# Patient Record
Sex: Female | Born: 1960 | Race: White | Hispanic: No
Health system: Midwestern US, Academic
[De-identification: ages and names within clinical notes are randomized; demographics above are authoritative.]

---

## 2018-07-09 ENCOUNTER — Encounter: Admit: 2018-07-09 | Discharge: 2018-07-09 | Payer: BC Managed Care – HMO | Primary: Adult Health

## 2018-07-09 ENCOUNTER — Encounter: Admit: 2018-07-09 | Discharge: 2018-07-09 | Payer: BC Managed Care – HMO | Primary: Family

## 2018-07-10 ENCOUNTER — Ambulatory Visit: Admit: 2018-07-10 | Discharge: 2018-07-11 | Payer: BC Managed Care – HMO | Primary: Family

## 2018-07-10 ENCOUNTER — Encounter: Admit: 2018-07-10 | Discharge: 2018-07-10 | Payer: BC Managed Care – HMO | Primary: Adult Health

## 2018-07-10 ENCOUNTER — Encounter: Admit: 2018-07-10 | Discharge: 2018-07-10 | Payer: BC Managed Care – HMO | Primary: Family

## 2018-07-10 DIAGNOSIS — F640 Transsexualism: Principal | ICD-10-CM

## 2018-07-10 DIAGNOSIS — F649 Gender identity disorder, unspecified: ICD-10-CM

## 2018-07-10 DIAGNOSIS — I2699 Other pulmonary embolism without acute cor pulmonale: ICD-10-CM

## 2018-07-10 DIAGNOSIS — I80202 Phlebitis and thrombophlebitis of unspecified deep vessels of left lower extremity: ICD-10-CM

## 2018-07-10 DIAGNOSIS — E139 Other specified diabetes mellitus without complications: Principal | ICD-10-CM

## 2018-07-10 MED ORDER — SPIRONOLACTONE 100 MG PO TAB
100 mg | ORAL_TABLET | Freq: Two times a day (BID) | ORAL | 3 refills | 90.00000 days | Status: DC
Start: 2018-07-10 — End: 2018-11-09

## 2018-07-10 NOTE — Progress Notes
[facility] Follow-Up Visit    Legal Name: Barbara Obrien  Preferred Name: Barbara Obrien  Preferred Pronouns: she/her/hers    Chief Complaint:  Follow up, establishing care as a transfer from Dr. Georgina Quint, but also has new diagnosis of DVT/PE from last week     HPI:  Barbara Obrien is a 58 y.o. transgender or gender nonconforming person hereto establish care/transition from DR. Glass.    Started hormones: 2013  Current regimen: Estrace 6mg Cleda Daub 100mg  BID  Gender therapist: none  PCP: Alfredo Martinez, NP  Prior gender surgeries/treatments: top surgery    Hx: Barbara Obrien started transition in 2013 with the support of her wife. Unfortunately at the same time, her wife was diagnosed with renal cancer and died in 01/21/12. She has adult boys in their 36s and an adopted 80 year old daughter. She mostly feels supported although one of her four brothers is not accepting.       Last week, Barbara Obrien developed a DVT and PE and was treated at [facility]. She is home now, and her PCP has removed her from E2 and started her on Eliquis. They hope she will only need the blood thinner for three months.       Review of Systems:  Constitutional: Negative for fatigue and unexpected weight change.   HENT: Negative for dental problem and hearing loss.    Eyes: Negative.  Negative for visual disturbance.   Respiratory: Negative for shortness of breath and wheezing.    Cardiovascular: Negative for chest pain.   Gastrointestinal: Negative for nausea, abdominal pain, diarrhea and constipation.   Endocrine: Negative for cold intolerance and heat intolerance.   Genitourinary: Negative for dysuria, urgency, frequency and difficulty urinating.   Musculoskeletal: Negative for joint swelling and arthralgias.   Skin: Negative for rash.   Allergic/Immunologic: Negative for immunocompromised state.   Neurological: Negative for dizziness, seizures, weakness and headaches.   Hematological: Negative.

## 2018-07-11 ENCOUNTER — Encounter: Admit: 2018-07-11 | Discharge: 2018-07-11 | Payer: BC Managed Care – HMO | Primary: Adult Health

## 2018-07-16 ENCOUNTER — Encounter: Admit: 2018-07-16 | Discharge: 2018-07-16 | Payer: BC Managed Care – HMO | Primary: Adult Health

## 2018-07-16 ENCOUNTER — Encounter: Admit: 2018-07-16 | Discharge: 2018-07-16 | Payer: BC Managed Care – HMO | Primary: Family

## 2018-07-17 ENCOUNTER — Encounter: Admit: 2018-07-17 | Discharge: 2018-07-17 | Payer: BC Managed Care – HMO | Primary: Adult Health

## 2018-07-17 DIAGNOSIS — F649 Gender identity disorder, unspecified: Principal | ICD-10-CM

## 2018-07-18 ENCOUNTER — Encounter: Admit: 2018-07-18 | Discharge: 2018-07-18 | Payer: BC Managed Care – HMO | Primary: Family

## 2018-07-19 ENCOUNTER — Encounter: Admit: 2018-07-19 | Discharge: 2018-07-19 | Payer: BC Managed Care – HMO | Primary: Adult Health

## 2018-07-25 ENCOUNTER — Encounter: Admit: 2018-07-25 | Discharge: 2018-07-25 | Payer: BC Managed Care – HMO | Primary: Family

## 2018-08-09 DIAGNOSIS — F640 Transsexualism: Secondary | ICD-10-CM

## 2018-08-09 NOTE — Progress Notes
CONSENT: Th obtained patient's verbal consent to provide this telebehavioral health visit due to the [facility] Emergency.    NAME: Barbara Obrien  DATE: 08/09/2018   DOB: 09/29/1960 TIME: 2:30 PM - 4:05 PM   MRN: [id number] PROVIDER: Kelli Hope, PhD     Present at [facility] Session: Patient    Type of Service Provided: Mental Health Intake and Evaluation via [facility], Gender Confirmation Surgical Clearance Letter Writing    Appropriateness of TBH: Th assessed and discussed the appropriateness of telebehavioral health with Barbara Obrien, and Barbara Obrien was determined: Appropriate    Presenting Problem: Barbara Obrien presented to intake to acquire a referral letter for gender confirmation surgery.     Current Stressors/Precipitants: Barbara Obrien attributed most of her problems to gender and genital dysphoria.    Coping Strategies/Self-Care Behaviors: Barbara Obrien reported that she copes with distress by seeking support from loved ones.    History of Presenting Problem/Treatment History: Barbara Obrien reported a history of gender dysphoria since age 26, which is documented in the referral letter for gender confirmation surgery.     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, and described her work positively.     Social Functioning: Barbara Obrien reported good social support from her daughter, her son, and her friends.    Substance Use History: Barbara Obrien denied any history of alcohol or drug use problems, or prescription drug misuse.    Medical/Developmental History: Barbara Obrien reported that she was hormone replacement therapy since age 63, but was forced to discontinue due a blood clot last spring. She has been living full-time as a woman since age 39. At age 50, Barbara Obrien underwent a breast augmentation, and noted that she is pleased with the results. Barbara Obrien is interested in an orchiectomy at this time.    Family History/Family Mental and Physical Health History: Barbara Obrien reported that her second wife passed of kidney cancer seven years ago. Barbara Obrien described their marriage of twenty years fondly and highlighted that her wife was very accepting of her gender identity. Barbara Obrien disclosed a previous marriage of five years, which she never revealed her true gender identity. Barbara Obrien reported that she has a 7 year old son from her first marriage and a 21 year old daughter from her second marriage. Barbara Obrien described her relationships with her children positively.     Trauma/Relationship Violence History: Barbara Obrien denied any history of emotional, physical, or sexual abuse, or witnessing domestic violence.    MENTAL STATUS EXAMINATION:   Appearance: well-groomed   Oriented to:  person, place, time   Relatedness:  engaged    Eye Contact:  good    Attitude:  cooperative    Motor Behavior:  normal   Affect:  appropriate, full-range   Mood:  euthymic   Speech Quality:  normal in rate, rhythm, and volume   Thought Processes:  logical   Judgement:  intact   Insight:  present   Sensorium: intact   Attention: intact   Memory:  intact   Risk Assessment: No suicidal or homicidal ideation with plan, means, or intent were expressed at time of intake.     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    TREATMENT INTERVENTION(S): Acceptance and Committment Therapy, Interpersonal Process Therapy, Surgical Referral Letter Writing    TREATMENT RESPONSE: Barbara Obrien was highlyreceptive to treatment interventions aimed at establishing rapport; orienting to telebehavioral health services; reviewing the [facility] criteria for surgery; providing psychoeducation on gender  confirmation surgery at [facility]; discussing the risks, benefits, alternatives, and limitations of surgery - including the implications for sexual and reproductive health; identifying therapeutic/surgical goals, addressing body/genital dysphoria, and writing a referral letter for gender confirmation surgery.     TREATMENT PLAN/GOALS: Plan is for Barbara Obrien to pursue gender confirmation surgery through [facility].     Kelli Hope, PhD  Licensed Psychologist

## 2018-08-10 ENCOUNTER — Encounter: Admit: 2018-08-10 | Discharge: 2018-08-10 | Primary: Family

## 2018-08-10 ENCOUNTER — Ambulatory Visit: Admit: 2018-08-09 | Discharge: 2018-08-10 | Primary: Family

## 2018-08-10 DIAGNOSIS — F649 Gender identity disorder, unspecified: Secondary | ICD-10-CM

## 2018-08-13 ENCOUNTER — Encounter: Admit: 2018-08-13 | Discharge: 2018-08-13 | Primary: Family

## 2018-10-16 ENCOUNTER — Ambulatory Visit: Admit: 2018-10-16 | Discharge: 2018-10-16 | Primary: Family

## 2018-10-16 ENCOUNTER — Encounter: Admit: 2018-10-16 | Discharge: 2018-10-16 | Primary: Family

## 2018-10-16 DIAGNOSIS — I2699 Other pulmonary embolism without acute cor pulmonale: Secondary | ICD-10-CM

## 2018-10-16 DIAGNOSIS — F649 Gender identity disorder, unspecified: Secondary | ICD-10-CM

## 2018-10-16 DIAGNOSIS — E139 Other specified diabetes mellitus without complications: Secondary | ICD-10-CM

## 2018-10-16 DIAGNOSIS — I80202 Phlebitis and thrombophlebitis of unspecified deep vessels of left lower extremity: Secondary | ICD-10-CM

## 2018-10-16 NOTE — Progress Notes
Obtained patient's verbal consent to treat them and their agreement to [facility] financial policy and NPP via this telehealth visit during the [facility] Emergency      Initial Consultation for Female-to-Female Gender Affirming Genital Surgery     Legal Name: Barbara Obrien  Preferred Name: Barbara Obrien  Preferred Pronouns: she/her/hers    Chief Complaint:  Gender Affirming Surgery Consult     HPI:  Barbara Obrien is a 58 y.o. transgender or gender nonconforming person interested in alleviating gender dysphoria through the feminizing of their body with gender affirming genital surgery.     Started hormones: 2013  Started living full time as chosen gender: 2014  Hair removal: none  BMI: Body mass index is 25.82 kg/m???.  Last tobacco: non smoker   Surgical support plan: family  Is patient actively engaged in therapy: No  Letters:    #1 Dr. Dirk Dress    #2 Lum Babe LCSW -- old letter from 2013      Reports having a UTI July 2020 which has been difficult to treat. Reports she has UTIs 1-2 times per year. Saw Dr. Linda Hedges, MD a urologist at [facility]. Reports she undergoes urethral dilation about once a year. Cystoscopy reportedly normal.     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 Took eliquis x 3 months. No plan for long term anticoagulation.     PCP: previously Dr. Georgina Quint.   Currently sees Darden Dates, APRN at [facility]   Lives with daughter and son        Medical History:   Diagnosis Date   ??? Deep vein thrombophlebitis of leg, left (HCC)    ??? Diabetes 1.5, managed as type 2 (HCC)    ??? Gender dysphoria    ??? Pulmonary emboli [facility])        Surgical History:   Procedure Laterality Date   ??? ACL RECONSTRUCTION Right    ??? HX BREAST AUGMENTATION Bilateral    ??? HX ROTATOR CUFF REPAIR Right        Social History     Tobacco Use   ??? Smoking status: Never Smoker   ??? Smokeless tobacco: Never Used   Substance Use Topics   ??? Alcohol use: Not Currently Alcohol/week: 1.0 standard drinks     Types: 1 Cans of beer per week       Family History   Problem Relation Age of Onset   ??? Cancer Mother    ??? Diabetes Mother    ??? Cancer Father        Medications:    Current Outpatient Medications:   ???  apixaban (ELIQUIS) 5 mg tablet, Take 5 mg by mouth., Disp: , Rfl:   ???  aspirin 81 mg chewable tablet, Chew 81 mg by mouth daily. Take with food., Disp: , Rfl:   ???  atorvastatin (LIPITOR) 20 mg tablet, Take 20 mg by mouth daily., Disp: , Rfl:   ???  estradioL (ESTRACE) 2 mg tablet, Take 6 mg by mouth daily., Disp: , Rfl:   ???  glipiZIDE (GLUCOTROL) 10 mg tablet, Take 10 mg by mouth twice daily., Disp: , Rfl:   ???  lisinopriL (ZESTRIL) 10 mg tablet, Take 10 mg by mouth daily., Disp: , Rfl:   ???  metFORMIN (GLUCOPHAGE) 1,000 mg tablet, Take 1,000 mg by mouth twice daily with meals., Disp: , Rfl:   ???  omeprazole DR (PRILOSEC) 20 mg  capsule, Take 20 mg by mouth daily before breakfast., Disp: , Rfl:   ???  spironolactone (ALDACTONE) 100 mg tablet, Take 100 mg by mouth twice daily. Take with food., Disp: , Rfl:   ???  spironolactone (ALDACTONE) 100 mg tablet, Take one tablet by mouth twice daily. Take with food., Disp: 180 tablet, Rfl: 3    Allergies:  Allergies   Allergen Reactions   ??? Sulfa (Sulfonamide Antibiotics) HIVES   ??? Tramadol UNKNOWN         Review of Systems:  Constitutional: Negative for fatigue and unexpected weight change.   HENT: Negative for dental problem and hearing loss.    Eyes: Negative.  Negative for visual disturbance.   Respiratory: Negative for shortness of breath and wheezing.    Cardiovascular: Negative for chest pain.   Gastrointestinal: Negative for nausea, abdominal pain, diarrhea and constipation.   Endocrine: Negative for cold intolerance and heat intolerance.   Genitourinary: Negative for dysuria, urgency, frequency and difficulty urinating.   Musculoskeletal: Negative for joint swelling and arthralgias.   Skin: Negative for rash. Allergic/Immunologic: Negative for immunocompromised state.   Neurological: Negative for dizziness, seizures, weakness and headaches.   Hematological: Negative.    Psychiatric/Behavioral: Negative for suicidal ideas, hallucinations, SI/HI, and decreased concentration. The patient is not nervous/anxious.      Physical Examination:  Ht 167.6 cm (66)  - Wt 72.6 kg (160 lb)  - BMI 25.82 kg/m???   Constitutional: Oriented to person, place, and time. Appears well-developed and well-nourished.   Head: Normocephalic.   Neck: Normal range of motion.   Cardiovascular: Normal rate.    Pulmonary/Chest: Effort normal.   Musculoskeletal: Normal range of motion.    Psychiatric: Normal mood and affect.        Assessment and Plan:     Gender Dysphoria   Understands limitations in surgical options in terms of her health, surgical risks and recovery. In a perfect world, would want fully depth penile inversion vaginoplasty. Would be satisfied if orchiectomy is the only option for.     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 And if that is the case, vulvoplasty (zero depth vagina) makes the most sense with her goals and medical conditions.    Reviewed our surgical preparation process.   Before surgery needs the following:     1- a1c 8% or less - documented by PCP clearance letter  2- cardiologist risk assessment  3- records from urologist   4- get up to date letter from Lum Babe for bottom surgery            Visit billed by time. Total Time 60 minutes. Estimated counseling time 45 minutes. Counseled patient regarding above issues.

## 2018-10-18 ENCOUNTER — Encounter: Admit: 2018-10-18 | Discharge: 2018-10-18 | Primary: Family

## 2018-10-18 NOTE — Progress Notes
sent release of information to patient to fill out and return.  Spoke to patient and discuss to do list for surgery.  Stated she understood. JPG

## 2018-10-19 ENCOUNTER — Encounter: Admit: 2018-10-19 | Discharge: 2018-10-19 | Primary: Family

## 2018-10-19 DIAGNOSIS — F649 Gender identity disorder, unspecified: Secondary | ICD-10-CM

## 2018-10-23 ENCOUNTER — Encounter: Admit: 2018-10-23 | Discharge: 2018-10-23 | Payer: BC Managed Care – HMO | Primary: Family

## 2018-10-24 ENCOUNTER — Encounter: Admit: 2018-10-24 | Discharge: 2018-10-24 | Primary: Family

## 2018-10-24 NOTE — Telephone Encounter
10/24/18 Records received from Debbora Presto, APRN. Record are available in pt chart through [facility] ed[facility]

## 2018-11-01 ENCOUNTER — Encounter: Admit: 2018-11-01 | Discharge: 2018-11-01 | Primary: Family

## 2018-11-01 DIAGNOSIS — I2699 Other pulmonary embolism without acute cor pulmonale: Secondary | ICD-10-CM

## 2018-11-01 DIAGNOSIS — I80202 Phlebitis and thrombophlebitis of unspecified deep vessels of left lower extremity: Secondary | ICD-10-CM

## 2018-11-01 DIAGNOSIS — E139 Other specified diabetes mellitus without complications: Secondary | ICD-10-CM

## 2018-11-01 DIAGNOSIS — F649 Gender identity disorder, unspecified: Secondary | ICD-10-CM

## 2018-11-02 ENCOUNTER — Encounter: Admit: 2018-11-02 | Discharge: 2018-11-02 | Primary: Family

## 2018-11-02 DIAGNOSIS — F649 Gender identity disorder, unspecified: Secondary | ICD-10-CM

## 2018-11-02 DIAGNOSIS — I80202 Phlebitis and thrombophlebitis of unspecified deep vessels of left lower extremity: Secondary | ICD-10-CM

## 2018-11-02 DIAGNOSIS — E139 Other specified diabetes mellitus without complications: Secondary | ICD-10-CM

## 2018-11-02 DIAGNOSIS — E119 Type 2 diabetes mellitus without complications: Secondary | ICD-10-CM

## 2018-11-02 DIAGNOSIS — I2699 Other pulmonary embolism without acute cor pulmonale: Secondary | ICD-10-CM

## 2018-11-02 DIAGNOSIS — N390 Urinary tract infection, site not specified: Secondary | ICD-10-CM

## 2018-11-09 ENCOUNTER — Encounter: Admit: 2018-11-09 | Discharge: 2018-11-09 | Primary: Family

## 2018-11-09 ENCOUNTER — Ambulatory Visit: Admit: 2018-11-09 | Discharge: 2018-11-09 | Primary: Family

## 2018-11-09 DIAGNOSIS — N390 Urinary tract infection, site not specified: Secondary | ICD-10-CM

## 2018-11-09 DIAGNOSIS — I159 Secondary hypertension, unspecified: Secondary | ICD-10-CM

## 2018-11-09 DIAGNOSIS — E118 Type 2 diabetes mellitus with unspecified complications: Secondary | ICD-10-CM

## 2018-11-09 DIAGNOSIS — E119 Type 2 diabetes mellitus without complications: Secondary | ICD-10-CM

## 2018-11-09 DIAGNOSIS — I2699 Other pulmonary embolism without acute cor pulmonale: Secondary | ICD-10-CM

## 2018-11-09 DIAGNOSIS — I82512 Chronic embolism and thrombosis of left femoral vein: Secondary | ICD-10-CM

## 2018-11-09 DIAGNOSIS — I80202 Phlebitis and thrombophlebitis of unspecified deep vessels of left lower extremity: Secondary | ICD-10-CM

## 2018-11-09 DIAGNOSIS — Z01810 Encounter for preprocedural cardiovascular examination: Secondary | ICD-10-CM

## 2018-11-09 DIAGNOSIS — F649 Gender identity disorder, unspecified: Secondary | ICD-10-CM

## 2018-11-09 DIAGNOSIS — E139 Other specified diabetes mellitus without complications: Secondary | ICD-10-CM

## 2018-11-09 DIAGNOSIS — I2693 Single subsegmental pulmonary embolism without acute cor pulmonale: Secondary | ICD-10-CM

## 2018-11-09 MED ORDER — ATORVASTATIN 20 MG PO TAB
10 mg | ORAL_TABLET | Freq: Every day | ORAL | 0 refills | Status: AC
Start: 2018-11-09 — End: ?

## 2018-11-09 NOTE — Progress Notes
Echo and NUC ordered per [place].  Pt requested testing to be done at [facility].  Faxed orders to [facility] scheduling.  Scheduling will call pt to schedule.  No Prior Auth required  Ref: [id number] CR

## 2018-11-09 NOTE — Patient Instructions
It was nice to see you today.    Dr. Virgina Organ would like an echo and stress test.  [facility] scheduling will call you directly to schedule.    Scheduling phone # [phone]

## 2018-11-15 ENCOUNTER — Ambulatory Visit: Admit: 2018-11-15 | Discharge: 2018-11-15 | Primary: Family

## 2018-11-15 ENCOUNTER — Encounter: Admit: 2018-11-15 | Discharge: 2018-11-15 | Primary: Family

## 2018-11-15 DIAGNOSIS — Z01810 Encounter for preprocedural cardiovascular examination: Secondary | ICD-10-CM

## 2018-11-15 DIAGNOSIS — I10 Essential (primary) hypertension: Secondary | ICD-10-CM

## 2018-11-19 ENCOUNTER — Encounter: Admit: 2018-11-19 | Discharge: 2018-11-19 | Payer: BC Managed Care – HMO | Primary: Family

## 2018-11-23 ENCOUNTER — Encounter: Admit: 2018-11-23 | Discharge: 2018-11-23 | Payer: BC Managed Care – HMO | Primary: Family

## 2018-11-23 NOTE — Telephone Encounter
spoke to patient this AM to check status for surgery.  1- a1c 8% or less - documented by PCP clearance letter- seeing PCP next week to discuss and check lab level.  Understands it must be 8% or lower  2- cardiologist risk assessment- done and results are in O[facility].  Cleared  3- records from urologist -Dr. Pearline Cables reviewing  4- get up to date letter from [facility] Axe for bottom surgery- Has txt out to [place] asking her to send    Barbara Obrien

## 2018-11-28 ENCOUNTER — Encounter: Admit: 2018-11-28 | Discharge: 2018-11-28 | Payer: BC Managed Care – HMO | Primary: Family

## 2018-11-28 NOTE — Telephone Encounter
[facility] asking for [facility] OV to be refaxed.  Refaxed for continuation of care. Attempted to call her per request, but no answer.      Fax #  [phone]

## 2018-11-30 ENCOUNTER — Encounter: Admit: 2018-11-30 | Discharge: 2018-11-30 | Payer: BC Managed Care – HMO | Primary: Family

## 2019-01-22 ENCOUNTER — Encounter: Admit: 2019-01-22 | Discharge: 2019-01-22 | Payer: BC Managed Care – HMO | Primary: Family

## 2019-01-23 ENCOUNTER — Encounter: Admit: 2019-01-23 | Discharge: 2019-01-23 | Payer: BC Managed Care – HMO | Primary: Family

## 2019-01-23 MED ORDER — SPIRONOLACTONE 100 MG PO TAB
100 mg | ORAL_TABLET | Freq: Two times a day (BID) | ORAL | 3 refills | 46.00000 days | Status: AC
Start: 2019-01-23 — End: ?

## 2020-02-11 ENCOUNTER — Encounter: Admit: 2020-02-11 | Discharge: 2020-02-11 | Payer: BC Managed Care – HMO | Primary: Family

## 2020-02-14 ENCOUNTER — Encounter: Admit: 2020-02-14 | Discharge: 2020-02-14 | Payer: BC Managed Care – HMO | Primary: Family

## 2020-03-05 ENCOUNTER — Encounter: Admit: 2020-03-05 | Discharge: 2020-03-05 | Payer: BC Managed Care – HMO | Primary: Family

## 2020-03-05 DIAGNOSIS — N35919 Unspecified urethral stricture, male, unspecified site: Secondary | ICD-10-CM

## 2020-04-22 MED ORDER — METHYLPREDNISOLONE 32 MG PO TAB
32 mg | ORAL_TABLET | ORAL | 0 refills | Status: AC
Start: 2020-04-22 — End: ?

## 2020-04-22 MED ORDER — DIPHENHYDRAMINE HCL 25 MG PO TAB
50 mg | ORAL_TABLET | Freq: Once | ORAL | 0 refills | 6.00000 days | Status: AC
Start: 2020-04-22 — End: ?

## 2020-04-23 ENCOUNTER — Encounter

## 2020-04-23 DIAGNOSIS — R300 Dysuria: Secondary | ICD-10-CM

## 2020-04-23 DIAGNOSIS — F649 Gender identity disorder, unspecified: Secondary | ICD-10-CM

## 2020-04-23 DIAGNOSIS — E139 Other specified diabetes mellitus without complications: Secondary | ICD-10-CM

## 2020-04-23 DIAGNOSIS — N35919 Unspecified urethral stricture, male, unspecified site: Secondary | ICD-10-CM

## 2020-04-23 DIAGNOSIS — I2699 Other pulmonary embolism without acute cor pulmonale: Secondary | ICD-10-CM

## 2020-04-23 DIAGNOSIS — I80202 Phlebitis and thrombophlebitis of unspecified deep vessels of left lower extremity: Secondary | ICD-10-CM

## 2020-04-23 DIAGNOSIS — N390 Urinary tract infection, site not specified: Secondary | ICD-10-CM

## 2020-04-23 DIAGNOSIS — E119 Type 2 diabetes mellitus without complications: Secondary | ICD-10-CM

## 2020-04-23 MED ORDER — IOTHALAMATE MEGLUMINE 60 % IJ SOLN
10 mL | Freq: Once | URETHRAL | 0 refills | Status: CP
Start: 2020-04-23 — End: ?
  Administered 2020-04-23: 15:00:00 20 mL via URETHRAL

## 2020-04-30 ENCOUNTER — Encounter: Admit: 2020-04-30 | Discharge: 2020-04-30 | Payer: No Typology Code available for payment source | Primary: Family

## 2020-04-30 MED ORDER — NITROFURANTOIN MONOHYD/M-CRYST 100 MG PO CAP
100 mg | ORAL_CAPSULE | Freq: Two times a day (BID) | ORAL | 0 refills | 7.00000 days | Status: AC
Start: 2020-04-30 — End: ?

## 2020-05-22 ENCOUNTER — Encounter: Admit: 2020-05-22 | Discharge: 2020-05-22 | Payer: No Typology Code available for payment source | Primary: Family

## 2020-05-22 NOTE — Progress Notes
Please send the following records for continuity of care:    STAT    Most recent office note  Most recent medication list  Most recent discharge summary  Most recent labs/imaging  Most recent hospital records and etc         Please ATTN to : Day      Thank you,    DaiJanise Day Justinian Miano, NRCMA   [facility]   Cardiovascular Medicine   [address] [place] [address]  Phone: [phone]  Fax: [phone]

## 2020-05-22 NOTE — Telephone Encounter
I called the patient to update the medical chart. We went over medications and PCP. I also was told labs there hasnt been any hospitalizations and labs were drawn at PCP office a month ago.I will request the records.

## 2020-05-25 ENCOUNTER — Encounter: Admit: 2020-05-25 | Discharge: 2020-05-25 | Payer: No Typology Code available for payment source | Primary: Family

## 2020-05-31 ENCOUNTER — Encounter: Admit: 2020-05-31 | Discharge: 2020-05-31 | Payer: No Typology Code available for payment source | Primary: Family

## 2020-05-31 DIAGNOSIS — I80202 Phlebitis and thrombophlebitis of unspecified deep vessels of left lower extremity: Secondary | ICD-10-CM

## 2020-05-31 DIAGNOSIS — N390 Urinary tract infection, site not specified: Secondary | ICD-10-CM

## 2020-05-31 DIAGNOSIS — F649 Gender identity disorder, unspecified: Secondary | ICD-10-CM

## 2020-05-31 DIAGNOSIS — E139 Other specified diabetes mellitus without complications: Secondary | ICD-10-CM

## 2020-05-31 DIAGNOSIS — E119 Type 2 diabetes mellitus without complications: Secondary | ICD-10-CM

## 2020-05-31 DIAGNOSIS — I2699 Other pulmonary embolism without acute cor pulmonale: Secondary | ICD-10-CM

## 2020-06-02 ENCOUNTER — Encounter: Admit: 2020-06-02 | Discharge: 2020-06-02 | Payer: No Typology Code available for payment source | Primary: Family

## 2020-06-02 DIAGNOSIS — E139 Other specified diabetes mellitus without complications: Secondary | ICD-10-CM

## 2020-06-02 DIAGNOSIS — E119 Type 2 diabetes mellitus without complications: Secondary | ICD-10-CM

## 2020-06-02 DIAGNOSIS — F649 Gender identity disorder, unspecified: Secondary | ICD-10-CM

## 2020-06-02 DIAGNOSIS — I80202 Phlebitis and thrombophlebitis of unspecified deep vessels of left lower extremity: Secondary | ICD-10-CM

## 2020-06-02 DIAGNOSIS — I2699 Other pulmonary embolism without acute cor pulmonale: Secondary | ICD-10-CM

## 2020-06-02 DIAGNOSIS — N390 Urinary tract infection, site not specified: Secondary | ICD-10-CM

## 2020-06-05 ENCOUNTER — Encounter: Admit: 2020-06-05 | Discharge: 2020-06-05 | Payer: No Typology Code available for payment source | Primary: Family

## 2020-06-05 ENCOUNTER — Ambulatory Visit: Admit: 2020-06-05 | Discharge: 2020-06-05 | Payer: No Typology Code available for payment source | Primary: Family

## 2020-06-05 DIAGNOSIS — E118 Type 2 diabetes mellitus with unspecified complications: Secondary | ICD-10-CM

## 2020-06-05 DIAGNOSIS — I2699 Other pulmonary embolism without acute cor pulmonale: Secondary | ICD-10-CM

## 2020-06-05 DIAGNOSIS — N390 Urinary tract infection, site not specified: Secondary | ICD-10-CM

## 2020-06-05 DIAGNOSIS — I2693 Single subsegmental pulmonary embolism without acute cor pulmonale: Secondary | ICD-10-CM

## 2020-06-05 DIAGNOSIS — I82512 Chronic embolism and thrombosis of left femoral vein: Secondary | ICD-10-CM

## 2020-06-05 DIAGNOSIS — E119 Type 2 diabetes mellitus without complications: Secondary | ICD-10-CM

## 2020-06-05 DIAGNOSIS — I80202 Phlebitis and thrombophlebitis of unspecified deep vessels of left lower extremity: Secondary | ICD-10-CM

## 2020-06-05 DIAGNOSIS — E139 Other specified diabetes mellitus without complications: Secondary | ICD-10-CM

## 2020-06-05 DIAGNOSIS — F649 Gender identity disorder, unspecified: Secondary | ICD-10-CM

## 2020-06-05 DIAGNOSIS — Z01810 Encounter for preprocedural cardiovascular examination: Secondary | ICD-10-CM

## 2020-06-05 MED ORDER — ROSUVASTATIN 20 MG PO TAB
20 mg | ORAL_TABLET | Freq: Every day | ORAL | 1 refills | 90.00000 days | Status: AC
Start: 2020-06-05 — End: ?

## 2020-06-05 NOTE — Progress Notes
Date of Service: 06/05/2020    Barbara Obrien is a 60 y.o. adult.       HPI     Patient is a 60 year old currently undergoing hormonal treatment and anticipated surgical treatment to transition from a female to female.    He does have a history of hypertension, diabetes mellitus type 2 with a severely abnormal hemoglobin A1c in the past that was 12%.  At present time patient is treated with Metformin and insulin.    In September 2020 he was evaluated with a perfusion imaging study, it was negative for ischemia and the LVEF was normal.    He was also evaluated with an echocardiogram on 11/15/2018, the study showed normal systolic function without any valvular abnormalities and normal pulmonary artery pressure.    Patient does not report having any symptoms of chest pain no heart palpitations.    He does have a history of left lower extremity DVT x2, he is currently anticoagulated with apixaban.  ,       Vitals:    06/05/20 1120   BP: 124/78   BP Source: Arm, Left Upper   Patient Position: Sitting   Pulse: 72   SpO2: 98%   Weight: 75.3 kg (166 lb)   Height: 170.2 cm (5' 7)   PainSc: Zero     Body mass index is 26 kg/m?Marland Kitchen     Past Medical History  Patient Active Problem List    Diagnosis Date Noted   ? Pre-operative cardiovascular examination 11/09/2018   ? Gender dysphoria 11/02/2018   ? Diabetes type 2, controlled (HCC) 11/02/2018   ? Pulmonary emboli (HCC) 11/02/2018   ? UTI (urinary tract infection) 11/02/2018   ? Deep vein thrombosis (DVT) of left lower extremity (HCC) 11/02/2018     06/29/2018  Left LE [place]:  Occlusive deep venous thrombosis appreciated within the left lower extremity veins.     04/27/2020 US Duplex Venous Reflux Study Bil, [facility].  LLE:  Nonocclusive thrombus seen within left popliteal vein and within mid to distal aspect of duplicated superficial femoral vein.  Chronic DVT.  Venous reflux study with evidence of minimal reflux within the left superficial femoral vein. Review of Systems   Constitutional: Negative.   HENT: Negative.    Eyes: Negative.    Cardiovascular: Negative.    Respiratory: Negative.    Endocrine: Negative.    Hematologic/Lymphatic: Negative.    Skin: Negative.    Musculoskeletal: Negative.    Gastrointestinal: Negative.    Genitourinary: Negative.    Neurological: Negative.    Psychiatric/Behavioral: Negative.    Allergic/Immunologic: Negative.        Physical Exam  General Appearance: normal in appearance  Skin: warm, moist, no ulcers or xanthomas  Eyes: conjunctivae and lids normal, pupils are equal and round  Lips & Oral Mucosa: no pallor or cyanosis  Neck Veins: neck veins are flat, neck veins are not distended  Chest Inspection: chest is normal in appearance, patient has breasts  Respiratory Effort: breathing comfortably, no respiratory distress  Auscultation/Percussion: lungs clear to auscultation, no rales or rhonchi, no wheezing  Cardiac Rhythm: regular rhythm and normal rate  Cardiac Auscultation: S1, S2 normal, no rub, no gallop  Murmurs: no murmur  Carotid Arteries: normal carotid upstroke bilaterally, no bruit  Lower Extremity Edema: no lower extremity edema  Abdominal Exam: soft, non-tender, no masses, bowel sounds normal  Liver & Spleen: no organomegaly  Language and Memory: patient responsive and seems  to comprehend information  Neurologic Exam: neurological assessment grossly intact      Cardiovascular Studies  Twelve-lead EKG demonstrated normal sinus rhythm, no ST segment T wave changes    Cardiovascular Health Factors  Vitals BP Readings from Last 3 Encounters:   06/05/20 124/78   04/23/20 (!) 155/79   11/15/18 128/81     Wt Readings from Last 3 Encounters:   06/05/20 75.3 kg (166 lb)   04/23/20 76.1 kg (167 lb 12.8 oz)   11/15/18 72.6 kg (160 lb)     BMI Readings from Last 3 Encounters:   06/05/20 26.00 kg/m?   04/23/20 26.28 kg/m?   11/15/18 25.82 kg/m?      Smoking Social History     Tobacco Use   Smoking Status Never Smoker Smokeless Tobacco Never Used      Lipid Profile Cholesterol   Date Value Ref Range Status   01/03/2020 216 (H) 100 - 199 Final     HDL   Date Value Ref Range Status   01/03/2020 44  Final     LDL   Date Value Ref Range Status   01/03/2020 138 (H) 0 - 99 Final     Triglycerides   Date Value Ref Range Status   01/03/2020 190 (H) 0 - 149 Final      Blood Sugar Hemoglobin A1C   Date Value Ref Range Status   10/08/2018 11.7 (H) 4.8 - 5.6 Final     Glucose   Date Value Ref Range Status   05/06/2020 145 (H) 65 - 99 Final   10/08/2018 409 (H) 66 - 99 Final   08/01/2018 330  Final          Problems Addressed Today  Encounter Diagnoses   Name Primary?   ? Single subsegmental pulmonary embolism without acute cor pulmonale (HCC) Yes   ? Chronic deep vein thrombosis (DVT) of femoral vein of left lower extremity (HCC)    ? Controlled diabetes mellitus type 2 with complications, unspecified whether long term insulin use (HCC)    ? Pre-operative cardiovascular examination    ? Gender identity disorder, unspecified        Assessment and Plan     In summary: This is a 60 year old with the following cardiovascular/clinical issues:    1.  Gender dysphoria?patient is currently in the process of undergoing hormonal treatment and surgical treatment to transition from a female to female  2.  Lower extremity DVT x2?patient is anticoagulated with apixaban  3.  History of subsegmental PE secondary to the lower extremity DVT  4.  Diabetes mellitus type 2 requiring insulin?historically this has been poorly controlled  5.  Hyperlipidemia?patient is on statin therapy  6.  Hypertension?patient is on metoprolol  7.  Family history of premature CAD?patient has 2 brothers that underwent stent placement  8.  Preoperative cardiovascular evaluation preceding genitalia surgery as part of transitioning from female to female  55.  Overall unremarkable cardiovascular evaluation performed September 2020    Plan:    1.  Continue all current medications  2.  Goal hemoglobin A1c is letter 7%  3.  Goal LDL less than 70 mg/dL  4.  From a cardiac standpoint this patient is stable to undergo general anesthesia and the planned surgical intervention, he is overall low risk from a cardiac standpoint.  In terms of risks and benefits of the procedure this will have to be discussed with the anesthesiologist on the surgeon at that time and this procedure will  be scheduled.    Total Time Today was 30 minutes in the following activities: Preparing to see the patient, Obtaining and/or reviewing separately obtained history, Performing a medically appropriate examination and/or evaluation, Counseling and educating the patient/family/caregiver, Ordering medications, tests, or procedures, Referring and communication with other health care professionals (when not separately reported), Documenting clinical information in the electronic or other health record, Independently interpreting results (not separately reported) and communicating results to the patient/family/caregiver and Care coordination (not separately reported)         Current Medications (including today's revisions)  ? amitriptyline (ELAVIL) 25 mg tablet Take 25 mg by mouth at bedtime daily.   ? apixaban (ELIQUIS) 5 mg tablet Take 5 mg by mouth twice daily.   ? atorvastatin (LIPITOR) 20 mg tablet Take one-half tablet by mouth daily.   ? glipiZIDE (GLUCOTROL) 10 mg tablet Take 10 mg by mouth daily.   ? JANUVIA 50 mg tablet Take 50 mg by mouth daily.   ? LANTUS SOLOSTAR U-100 INSULIN 100 unit/mL (3 mL) injection PEN Inject 10 Units under the skin at bedtime daily.   ? lisinopriL (ZESTRIL) 10 mg tablet Take 10 mg by mouth daily.   ? metFORMIN (GLUCOPHAGE) 1,000 mg tablet Take 500 mg by mouth twice daily with meals.   ? methylprednisolone (MEDROL) 32 mg tablet Take one tablet by mouth as directed. Take 32mg  by mouth 12 hours before appointment, then take 32mg  by mouth 2 hours before appointment time   ? metoprolol tartrate 25 mg tablet Take 12.5 mg by mouth twice daily.   ? omeprazole DR (PRILOSEC) 20 mg capsule Take 20 mg by mouth daily before breakfast.   ? ondansetron (ZOFRAN ODT) 4 mg rapid dissolve tablet Dissolve 1 tablet by mouth as Needed.   ? spironolactone (ALDACTONE) 100 mg tablet Take one tablet by mouth twice daily. Take with food.

## 2020-07-09 ENCOUNTER — Encounter: Admit: 2020-07-09 | Discharge: 2020-07-09 | Payer: No Typology Code available for payment source | Primary: Family

## 2020-07-10 ENCOUNTER — Encounter: Admit: 2020-07-10 | Discharge: 2020-07-10 | Payer: No Typology Code available for payment source | Primary: Family

## 2020-07-10 NOTE — Progress Notes
Hgb A1c received from [facility], dated 07/08/20. Hgb A1c 6.8, will place results to be scanned into chart.

## 2020-07-13 ENCOUNTER — Encounter: Admit: 2020-07-13 | Discharge: 2020-07-13 | Payer: No Typology Code available for payment source | Primary: Family

## 2020-07-20 ENCOUNTER — Encounter: Admit: 2020-07-20 | Discharge: 2020-07-20 | Payer: No Typology Code available for payment source | Primary: Family

## 2020-08-31 ENCOUNTER — Encounter: Admit: 2020-08-31 | Discharge: 2020-08-31 | Payer: No Typology Code available for payment source | Primary: Family

## 2020-09-02 ENCOUNTER — Encounter: Admit: 2020-09-02 | Discharge: 2020-09-02 | Payer: No Typology Code available for payment source | Primary: Family

## 2020-09-02 NOTE — Telephone Encounter
Received surgery support letter from Lum Babe for bottom surgery. Will scan into chart.

## 2020-09-09 ENCOUNTER — Encounter: Admit: 2020-09-09 | Discharge: 2020-09-09 | Payer: No Typology Code available for payment source | Primary: Family

## 2020-09-09 DIAGNOSIS — F649 Gender identity disorder, unspecified: Secondary | ICD-10-CM

## 2020-10-26 ENCOUNTER — Encounter: Admit: 2020-10-26 | Discharge: 2020-10-26 | Payer: No Typology Code available for payment source | Primary: Family

## 2020-11-22 ENCOUNTER — Encounter: Admit: 2020-11-22 | Discharge: 2020-11-22 | Payer: No Typology Code available for payment source | Primary: Family

## 2020-11-22 MED ORDER — ROSUVASTATIN 20 MG PO TAB
ORAL_TABLET | Freq: Every day | 1 refills
Start: 2020-11-22 — End: ?

## 2021-01-18 ENCOUNTER — Encounter: Admit: 2021-01-18 | Discharge: 2021-01-18 | Payer: No Typology Code available for payment source | Primary: Family

## 2021-02-11 ENCOUNTER — Encounter: Admit: 2021-02-11 | Discharge: 2021-02-11 | Payer: No Typology Code available for payment source | Primary: Family

## 2021-03-26 ENCOUNTER — Encounter: Admit: 2021-03-26 | Discharge: 2021-03-26 | Payer: No Typology Code available for payment source | Primary: Family

## 2021-04-01 ENCOUNTER — Encounter: Admit: 2021-04-01 | Discharge: 2021-04-01 | Payer: No Typology Code available for payment source | Primary: Family

## 2021-04-01 ENCOUNTER — Ambulatory Visit: Admit: 2021-04-01 | Discharge: 2021-04-01 | Payer: No Typology Code available for payment source | Primary: Family

## 2021-04-01 DIAGNOSIS — F649 Gender identity disorder, unspecified: Secondary | ICD-10-CM

## 2021-04-01 DIAGNOSIS — E119 Type 2 diabetes mellitus without complications: Secondary | ICD-10-CM

## 2021-04-01 DIAGNOSIS — E139 Other specified diabetes mellitus without complications: Secondary | ICD-10-CM

## 2021-04-01 DIAGNOSIS — N390 Urinary tract infection, site not specified: Secondary | ICD-10-CM

## 2021-04-01 DIAGNOSIS — I2699 Other pulmonary embolism without acute cor pulmonale: Secondary | ICD-10-CM

## 2021-04-01 DIAGNOSIS — I80202 Phlebitis and thrombophlebitis of unspecified deep vessels of left lower extremity: Secondary | ICD-10-CM

## 2021-04-01 DIAGNOSIS — Z125 Encounter for screening for malignant neoplasm of prostate: Secondary | ICD-10-CM

## 2021-04-01 DIAGNOSIS — F640 Transsexualism: Secondary | ICD-10-CM

## 2021-04-01 NOTE — Progress Notes
Subjective:       History of Present Illness  Barbara Obrien is a 61 y.o. adult.  Presents today to discuss bottom surgery.     Review of Systems   Constitutional: Negative.    HENT: Negative.    Eyes: Negative.    Respiratory: Negative.    Cardiovascular: Negative.    Gastrointestinal: Negative.    Endocrine: Negative.    Genitourinary: Negative.    Musculoskeletal: Positive for arthralgias.   Skin: Negative.    Allergic/Immunologic: Negative.    Neurological: Negative.    Hematological: Negative.    Psychiatric/Behavioral: Negative.        Medical History:   Diagnosis Date   ? Deep vein thrombophlebitis of leg, left (HCC) 11/02/2018   ? Diabetes 1.5, managed as type 2 (HCC)    ? Diabetes type 2, controlled (HCC) 11/02/2018   ? Gender dysphoria    ? Gender dysphoria 11/02/2018   ? Pulmonary emboli (HCC)    ? Pulmonary emboli (HCC) 11/02/2018   ? UTI (urinary tract infection) 11/02/2018     Surgical History:   Procedure Laterality Date   ? ACL RECONSTRUCTION Right    ? HX BREAST AUGMENTATION Bilateral    ? HX ROTATOR CUFF REPAIR Right      Family History   Problem Relation Age of Onset   ? Cancer Mother    ? Diabetes Mother    ? Cancer Father    ? Heart Attack Brother    ? Heart Attack Brother      Social History     Socioeconomic History   ? Marital status: Widowed   Tobacco Use   ? Smoking status: Never   ? Smokeless tobacco: Never   Substance and Sexual Activity   ? Alcohol use: Yes     Alcohol/week: 1.0 standard drink     Types: 1 Cans of beer per week     Comment: Occasional   ? Drug use: Not Currently   ? Sexual activity: Not Currently       Objective:         ? amitriptyline (ELAVIL) 25 mg tablet Take 25 mg by mouth at bedtime daily.   ? apixaban (ELIQUIS) 5 mg tablet Take 5 mg by mouth twice daily.   ? glipiZIDE (GLUCOTROL) 10 mg tablet Take 10 mg by mouth daily.   ? JANUVIA 50 mg tablet Take 50 mg by mouth daily.   ? LANTUS SOLOSTAR U-100 INSULIN 100 unit/mL (3 mL) injection PEN Inject 10 Units under the skin at bedtime daily.   ? lisinopriL (ZESTRIL) 10 mg tablet Take 10 mg by mouth daily.   ? metFORMIN (GLUCOPHAGE) 1,000 mg tablet Take 500 mg by mouth twice daily with meals.   ? methylprednisolone (MEDROL) 32 mg tablet Take one tablet by mouth as directed. Take 32mg  by mouth 12 hours before appointment, then take 32mg  by mouth 2 hours before appointment time   ? metoprolol tartrate 25 mg tablet Take 12.5 mg by mouth twice daily.   ? omeprazole DR (PRILOSEC) 20 mg capsule Take 20 mg by mouth daily before breakfast.   ? ondansetron (ZOFRAN ODT) 4 mg rapid dissolve tablet Dissolve 1 tablet by mouth as Needed.   ? rosuvastatin (CRESTOR) 20 mg tablet TAKE ONE TABLET BY MOUTH EVERY DAY   ? spironolactone (ALDACTONE) 100 mg tablet Take one tablet by mouth twice daily. Take with food.     Vitals:    04/01/21 0800   BP: Marland Kitchen)  157/87   Pulse: 92   PainSc: Zero   Weight: 74.4 kg (164 lb 1.6 oz)   Height: 170.2 cm (5' 7)     Body mass index is 25.7 kg/m?Marland Kitchen     Physical Exam  Vitals reviewed.   HENT:      Head: Atraumatic.   Eyes:      General: No scleral icterus.  Cardiovascular:      Rate and Rhythm: Normal rate.   Pulmonary:      Effort: Pulmonary effort is normal.   Abdominal:      General: Abdomen is flat.   Genitourinary:      Skin:     General: Skin is warm.   Neurological:      General: No focal deficit present.      Mental Status: She is alert.              Assessment and Plan:    Problem   Gender Dysphoria in Adult            Diabetes- last hemoglobin A1c was 7.6.  Recommended that she have 7 or less.  Reports that her that her average blood sugar may go up to 380 every afternoon or evening.  It may get as low as 80 or 90 during the day.  Counseled her that she would need an A1c of 7 or less before her operation.  We will need a repeat A1c check before her operation.  Kidney function-reports relative stability with her creatinine.  Also would recommend a recheck before her operation.  UTI-frequent urination has resolved.  Has been put on Macrobid by her care physician.      Discussed the risks, benefits, alternatives and, limitations to gender reassignment surgery.  Went over the proposed procedure which included: Bilateral orchiectomy, scrotectomy, penectomy with creation of a neovagina with penile and scrotal skin.  The scrotal skin may be used as a full-thickness skin graft for the internal vagina.  The penile skin would become the skin for the external vaginal opening.  Complications that we discussed included: Bleeding, infection, loss of skin graft, scarring, retained hair follicles, bladder injury, urethral injury, injury to the prostate, injury to the rectum, fecal or urinary incontinence, DVT/PE.  We also discussed inability to achieve orgasm and or loss of sensation. The surgery would be irreversible and cause sterility. Finally, she understands life long dilation would be necessary to maintain her vagina and length or depth would be determined by the available skin from her penile shaft and scrotum.    Risk would be greatly reduced for vulvoplasty only.  Would be higher risk for perioperative bleeding complications, PE/DVT    45    Minute visit including time spent reviewing records prior and after the visit, exam, counseling the patient and the family, discussion with Dr. Larita Fife , documenting.

## 2021-05-23 ENCOUNTER — Encounter: Admit: 2021-05-23 | Discharge: 2021-05-23 | Payer: Commercial Managed Care - PPO | Primary: Family

## 2021-05-23 ENCOUNTER — Emergency Department
Admit: 2021-05-23 | Discharge: 2021-05-23 | Disposition: A | Discharge status: Home / Self Care | Payer: Commercial Managed Care - PPO | Attending: Student in an Organized Health Care Education/Training Program

## 2021-05-23 DIAGNOSIS — R339 Retention of urine, unspecified: Secondary | ICD-10-CM

## 2021-05-23 DIAGNOSIS — I2699 Other pulmonary embolism without acute cor pulmonale: Secondary | ICD-10-CM

## 2021-05-23 DIAGNOSIS — F649 Gender identity disorder, unspecified: Secondary | ICD-10-CM

## 2021-05-23 DIAGNOSIS — E119 Type 2 diabetes mellitus without complications: Secondary | ICD-10-CM

## 2021-05-23 DIAGNOSIS — E139 Other specified diabetes mellitus without complications: Secondary | ICD-10-CM

## 2021-05-23 DIAGNOSIS — N35914 Unspecified anterior urethral stricture, male: Secondary | ICD-10-CM

## 2021-05-23 DIAGNOSIS — N390 Urinary tract infection, site not specified: Secondary | ICD-10-CM

## 2021-05-23 DIAGNOSIS — I80202 Phlebitis and thrombophlebitis of unspecified deep vessels of left lower extremity: Secondary | ICD-10-CM

## 2021-05-23 LAB — POC GLUCOSE: POC GLUCOSE: 146 mg/dL — ABNORMAL HIGH (ref 70–100)

## 2021-05-23 MED ORDER — CEFAZOLIN INJ 1GM IVP
2 g | Freq: Once | INTRAVENOUS | 0 refills | Status: CP
Start: 2021-05-23 — End: ?
  Administered 2021-05-23: 18:00:00 2 g via INTRAVENOUS

## 2021-05-23 MED ORDER — CEPHALEXIN 500 MG PO CAP
500 mg | ORAL_CAPSULE | Freq: Two times a day (BID) | ORAL | 0 refills | Status: AC
Start: 2021-05-23 — End: ?

## 2021-05-23 MED ORDER — FENTANYL CITRATE (PF) 50 MCG/ML IJ SOLN
25 ug | Freq: Once | INTRAVENOUS | 0 refills | Status: CP
Start: 2021-05-23 — End: ?
  Administered 2021-05-23: 18:00:00 25 ug via INTRAVENOUS

## 2021-05-23 NOTE — ED Notes
Pt ambulated out of ED with steady gait with friend.

## 2021-05-23 NOTE — ED Notes
Pt A&Ox4 and given discharge instructions and follow up information. Pt verbalizes understanding and has no further questions or complaints. VSS. Arranged ride made by pt.

## 2021-05-23 NOTE — Procedures
Procedure Note    Name: Malinda Mccroskey is a 61 y.o. adult     DOB: 12-26-1960             MRN#: [id number]  DATE OF OPERATION:     Date:  05/23/2021        Preoperative Dx:   Urethral stricture     Postoperative Dx:   Urethral stricture    Findings:  Dense anterior urethral stricture, urinary rentention with ~500 cc after foley placement     Procedure: Urethroscopy, urethral dilation, foley catheter placement over a wire     After informed consent obtained, she was placed in supine position then prepped in the usual fashion. We attempted to cannulate the meatus with a flexible cystoscope however this was not possible due to meatal stricture. A sensor wire was then passed through the meatus and into the bladder. Using S-curve dilators, we sequential dilated the anterior urethra up to 18 french. A council tip catheter was then placed over the wire and into the bladder. A return of ~500cc of yellow urine after foley placement. She tolerated the procedure well, and left the exam room in a pleasant disposition.    Assessment/ Plan:    1.  Continue foley catheter upon discharge, will arrange a voiding trial   2.  Prophylactic abx  3.  Follow up with Dr. Rolena Infante for gender affirming surgery.         Orders Placed This Encounter    POC GLUCOSE    ceFAZolin (ANCEF) IVP 2 g    fentaNYL citrate PF (SUBLIMAZE) injection 25 mcg    cephalexin (KEFLEX) 500 mg capsule       Estimated Blood Loss: 1 mL    Complications:  None    Drains: Foley Catheter: 10 ml mL    Disposition:  Discharge from ED    Amada Jupiter, MD  Pager [phone]

## 2021-05-23 NOTE — ED Notes
Catheter supplies given to pt by this RN.

## 2021-05-23 NOTE — ED Notes
Pt is a 61 y.o. female who presents to the ED with cc of urinary pain. Reports having known stricture with hx of catheter placement. Reports urinary retention and pain starting last night. Female reproductive organs present. Went to [facility] this AM and had unsuccessful attempt to have foley placed by physician. Referred to here at [facility]. Rates pain as a urgency/burning pain states 6/10 after fentanyl administration en route by EMS. Denies currently taking abx. Denies N/V/D, chest pain, SOA, or dizziness. Hooked up to all monitors with hypertension noted.   Medical History:   Diagnosis Date    Deep vein thrombophlebitis of leg, left (HCC) 11/02/2018    Diabetes 1.5, managed as type 2 (HCC)     Diabetes type 2, controlled (HCC) 11/02/2018    Gender dysphoria     Gender dysphoria 11/02/2018    Pulmonary emboli [facility])     Pulmonary emboli (HCC) 11/02/2018    UTI (urinary tract infection) 11/02/2018

## 2021-05-31 ENCOUNTER — Encounter: Admit: 2021-05-31 | Discharge: 2021-05-31 | Payer: Commercial Managed Care - PPO | Primary: Family

## 2021-05-31 ENCOUNTER — Ambulatory Visit: Admit: 2021-05-31 | Discharge: 2021-05-31 | Payer: Commercial Managed Care - PPO | Primary: Family

## 2021-05-31 DIAGNOSIS — I2699 Other pulmonary embolism without acute cor pulmonale: Secondary | ICD-10-CM

## 2021-05-31 DIAGNOSIS — E139 Other specified diabetes mellitus without complications: Secondary | ICD-10-CM

## 2021-05-31 DIAGNOSIS — I80202 Phlebitis and thrombophlebitis of unspecified deep vessels of left lower extremity: Secondary | ICD-10-CM

## 2021-05-31 DIAGNOSIS — N390 Urinary tract infection, site not specified: Secondary | ICD-10-CM

## 2021-05-31 DIAGNOSIS — F649 Gender identity disorder, unspecified: Secondary | ICD-10-CM

## 2021-05-31 DIAGNOSIS — E119 Type 2 diabetes mellitus without complications: Secondary | ICD-10-CM

## 2021-06-03 ENCOUNTER — Encounter: Admit: 2021-06-03 | Discharge: 2021-06-03 | Payer: Commercial Managed Care - PPO | Primary: Family

## 2021-06-10 ENCOUNTER — Encounter: Admit: 2021-06-10 | Discharge: 2021-06-10 | Payer: Commercial Managed Care - PPO | Primary: Family

## 2021-06-16 ENCOUNTER — Encounter: Admit: 2021-06-16 | Discharge: 2021-06-16 | Payer: Commercial Managed Care - PPO | Primary: Family

## 2021-06-16 DIAGNOSIS — R300 Dysuria: Secondary | ICD-10-CM

## 2021-06-16 MED ORDER — CEPHALEXIN 500 MG PO CAP
500 mg | ORAL_CAPSULE | Freq: Four times a day (QID) | ORAL | 0 refills | Status: AC
Start: 2021-06-16 — End: ?

## 2021-07-01 ENCOUNTER — Encounter: Admit: 2021-07-01 | Discharge: 2021-07-01 | Payer: Commercial Managed Care - PPO | Primary: Family

## 2021-07-01 ENCOUNTER — Ambulatory Visit: Admit: 2021-07-01 | Discharge: 2021-07-02 | Payer: Commercial Managed Care - PPO | Primary: Family

## 2021-07-01 DIAGNOSIS — F649 Gender identity disorder, unspecified: Secondary | ICD-10-CM

## 2021-07-01 NOTE — Progress Notes
CONSENT: Th obtained patient's verbal consent to provide this telebehavioral health visit due to the [facility].    NAME: Barbara Obrien  DATE: 07/01/2021   DOB: February 17, 1961 TIME: 9:00 AM - 9:38 AM   MRN: [id number] PROVIDER: Kelli Hope, PhD     PRESENT AT TELEBEHAVIORAL HEALTH SESSION: Patient    TYPE OF TREATMENT PROVIDED: Individual Therapy, Gender Confirmation Surgical Clearance Letter Writing via Telebehavioral Health     TREATMENT INTERVENTION(S): Acceptance and Committment Therapy, Surgical Letter Writing    CHIEF COMPLAINT/RELEVANT HISTORY: Jaiah reported that she would like to undergo vulvoplasty surgery. Th/Emer discussed current health barriers, as she will need to lower her hemoglobin A1C before undergoing surgery. Meylin was relieved to share that surgery will address the urethral strictures she has suffered.Th/Runa reviewed her post-surgical recovery plan and identified her social supports and resources. Th/Deneisha amended her letter for surgery. Tionna was highly receptive to treatment interventions.    MENTAL STATUS EXAMINATION:   Appearance: well-groomed   Oriented to:  person, place, time   Relatedness:  engaged    Eye Contact:  good    Attitude:  cooperative    Motor Behavior:  normal   Affect:  appropriate, full-range   Mood:  euthymic   Speech Quality:  normal in rate, rhythm, and volume   Thought Processes:  logical   Judgement:  intact   Insight:  present   Sensorium: intact   Attention: intact   Memory:  intact   Risk Assessment: No suicidal or homicidal ideation with plan, means, or intent were expressed at time of session.     TREATMENT PROGRESS: maintaining past gains/stable    TREATMENT RESPONSE: Quanesha was highly receptive to treatment interventions aimed at establishing rapport; reviewing the [facility] criteria for surgery; providing psychoeducation on gender confirmation surgery at [facility]; discussing the risks, benefits, alternatives, and limitations of surgery - including the implications for sexual and reproductive health; identifying therapeutic/surgical goals, addressing gender dysphoria, and amending her letter for gender confirmation vulvoplasty surgery.     TREATMENT PLAN/GOALS: Plan is for Oriana to pursue vulvoplasty surgery through [facility] Diversity clinic.     Kelli Hope, PhD  Licensed Psychologist

## 2021-07-04 ENCOUNTER — Encounter: Admit: 2021-07-04 | Discharge: 2021-07-04 | Payer: Commercial Managed Care - PPO | Primary: Family

## 2021-07-04 DIAGNOSIS — I80202 Phlebitis and thrombophlebitis of unspecified deep vessels of left lower extremity: Secondary | ICD-10-CM

## 2021-07-04 DIAGNOSIS — E119 Type 2 diabetes mellitus without complications: Secondary | ICD-10-CM

## 2021-07-04 DIAGNOSIS — E139 Other specified diabetes mellitus without complications: Secondary | ICD-10-CM

## 2021-07-04 DIAGNOSIS — N390 Urinary tract infection, site not specified: Secondary | ICD-10-CM

## 2021-07-04 DIAGNOSIS — I2699 Other pulmonary embolism without acute cor pulmonale: Secondary | ICD-10-CM

## 2021-07-04 DIAGNOSIS — F649 Gender identity disorder, unspecified: Secondary | ICD-10-CM

## 2021-07-19 ENCOUNTER — Encounter: Admit: 2021-07-19 | Discharge: 2021-07-19 | Payer: Commercial Managed Care - PPO | Primary: Family

## 2021-07-19 NOTE — Progress Notes
Records Request- STAT    Medical records request for continuation of care:    Patient has appointment with  Dr. Avie Arenas    Please fax records to Cardiovascular Medicine [place] [facility] [phone]    Request records:    Barbara Obrien  10-Sep-1960        Most recent labs including last LIPID    Thank you,      Cardiovascular Medicine  [facility]  [address]  [place], [place] [address]  Phone:  [phone]  Fax:  [phone]

## 2021-07-20 ENCOUNTER — Encounter: Admit: 2021-07-20 | Discharge: 2021-07-20 | Payer: Commercial Managed Care - PPO | Primary: Family

## 2021-07-20 DIAGNOSIS — Z01810 Encounter for preprocedural cardiovascular examination: Secondary | ICD-10-CM

## 2021-07-20 DIAGNOSIS — R0609 Other forms of dyspnea: Secondary | ICD-10-CM

## 2021-07-20 DIAGNOSIS — I2699 Other pulmonary embolism without acute cor pulmonale: Secondary | ICD-10-CM

## 2021-07-20 DIAGNOSIS — F640 Transsexualism: Secondary | ICD-10-CM

## 2021-07-20 DIAGNOSIS — F649 Gender identity disorder, unspecified: Secondary | ICD-10-CM

## 2021-07-20 DIAGNOSIS — E139 Other specified diabetes mellitus without complications: Secondary | ICD-10-CM

## 2021-07-20 DIAGNOSIS — I2693 Single subsegmental pulmonary embolism without acute cor pulmonale: Secondary | ICD-10-CM

## 2021-07-20 DIAGNOSIS — E119 Type 2 diabetes mellitus without complications: Secondary | ICD-10-CM

## 2021-07-20 DIAGNOSIS — I82512 Chronic embolism and thrombosis of left femoral vein: Secondary | ICD-10-CM

## 2021-07-20 DIAGNOSIS — Z136 Encounter for screening for cardiovascular disorders: Secondary | ICD-10-CM

## 2021-07-20 DIAGNOSIS — N390 Urinary tract infection, site not specified: Secondary | ICD-10-CM

## 2021-07-20 DIAGNOSIS — I80202 Phlebitis and thrombophlebitis of unspecified deep vessels of left lower extremity: Secondary | ICD-10-CM

## 2021-07-20 DIAGNOSIS — E118 Type 2 diabetes mellitus with unspecified complications: Secondary | ICD-10-CM

## 2021-07-20 NOTE — Patient Instructions
Echo   Stress test   MAC Nuclear Stress Test Instructions    PLEASE REPORT TO:    ____[facility] [phone], [place], [place]) - [phone]   ____[facility] ([address], [address], [place], [place]) - [phone]    ____[facility] ([address] [place], [place]) - [phone]    ____[facility] ([address], [place], [place]) - [phone]   ____[facility] ([address] [place], [place] ) - [phone]     MAC Main Phone Number: [phone]     Date of Test  ____________  at ____________  for ________________________    Are you able to raise your arm up by your head for about 20 minutes? yes  Can you lie on your back for approximately 20 minutes with minimal movement? yes     The Thallium evaluation has two parts -- two nuclear scans.   The first scan is done in the morning and the second three to four hours later.     Wear comfortable clothing. Bring or wear comfortable walking shoes.     It is recommended not to hold infants for 2 to 3 days after the test.   Please let the nuclear technologists know if you plan on flying after the test.     NO DECAF OR CAFFEINE 24 HOURS PRIOR TO TEST. Examples: coffee, tea, decaf coffee or tea, cola, chocolate.     DO NOT EAT OR DRINK THE MORNING OF YOUR TEST unless otherwise instructed. (You may have a couple sips of water.) If you are a diabetic, if insulin dependent: please take one third of your insulin with a light breakfast (two pieces of dry toast and a small juice). Bring insulin and medication with you to the test.     ___ TAKE MORNING MEDICATIONS WITH A COUPLE SIPS OF WATER PRIOR TO TEST.     Hold all vitamins and supplements on the morning of your test.     HOLD THE FOLLOWING MEDICATIONS AS INDICATED BELOW:      Diabetes pills -- metformin (Glucophage) -- on the morning before the procedure.       WHAT TO DO BETWEEN THE FIRST TWO THALLIUM SCANS:  1. No strenuous exercise should be performed during this time.  2. A light lunch is permissible. The technologist will give you a list of appropriate foods.  3. Please return 15 minutes prior to the schedule of your second scan. Our nuclear technologist will tell you exactly what time to return.  4. Please do not use tobacco products in between scans.  5. After the first scan is completed, you may resume usual medications.     TEST FINDINGS:  You will receive the results of the test within 7 business days of its completion by telephone, unless arranged differently at the time of the procedure.  If you have any questions concerning your thallium test or if you do not hear from your [facility] physician/or nurse within 7 business days, please call the appropriate office checked above.

## 2021-07-20 NOTE — Progress Notes
Date of Service: 07/20/2021    Barbara Obrien is a 61 y.o. adult.       HPI     Barbara Obrien is a 61 y.o. adult with a history of hypertension, hyperlipidemia, poorly controlled diabetes mellitus (patient states is the most recent hemoglobin A1c was ~11%), history of DVT and PE, patient continues on apixaban therapy, gender dysphoria and currently in the process of undergoing treatment/future surgical intervention of transitioning from female to female.    Patient does not have any history of chest pain, no heart palpitations.  The last cardiac/ischemic evaluation was performed in September 2020.     Vitals:    07/20/21 0855   BP: 122/78   BP Source: Arm, Left Upper   Pulse: 77   SpO2: 98%   O2 Device: None (Room air)   PainSc: Zero   Weight: 75.8 kg (167 lb)   Height: 170.2 cm (5' 7)     Body mass index is 26.16 kg/m?Marland Kitchen     Past Medical History  Patient Active Problem List    Diagnosis Date Noted   ? Gender dysphoria 04/01/2021   ? Preop cardiovascular exam 11/09/2018   ? 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 Diabetes type 2, controlled (HCC) 11/02/2018   ? Pulmonary emboli (HCC) 11/02/2018   ? UTI (urinary tract infection) 11/02/2018   ? Deep vein thrombosis (DVT) of left lower extremity (HCC) 11/02/2018     06/29/2018  Left LE [place]:  Occlusive deep venous thrombosis appreciated within the left lower extremity veins.     04/27/2020 US Duplex Venous Reflux Study Bil, [facility].  LLE:  Nonocclusive thrombus seen within left popliteal vein and within mid to distal aspect of duplicated superficial femoral vein.  Chronic DVT.  Venous reflux study with evidence of minimal reflux within the left superficial femoral vein.           Review of Systems   Constitutional: Negative.   HENT: Negative.    Eyes: Negative.    Cardiovascular: Negative.    Respiratory: Negative.    Endocrine: Negative.    Hematologic/Lymphatic: Negative.    Skin: Negative.    Musculoskeletal: Negative. Gastrointestinal: Negative.    Genitourinary: Negative.    Neurological: Negative.    Psychiatric/Behavioral: Negative.    Allergic/Immunologic: Negative.        Physical Exam  General Appearance: Patient is a female in the process of transitioning to a female  Skin: warm, moist, no ulcers or xanthomas  Eyes: conjunctivae and lids normal, pupils are equal and round  Lips & Oral Mucosa: no pallor or cyanosis  Neck Veins: neck veins are flat, neck veins are not distended  Chest Inspection: Breasts are present  Respiratory Effort: breathing comfortably, no respiratory distress  Auscultation/Percussion: lungs clear to auscultation, no rales or rhonchi, no wheezing  Cardiac Rhythm: regular rhythm and normal rate  Cardiac Auscultation: S1, S2 normal, no rub, no gallop  Murmurs: no murmur  Carotid Arteries: normal carotid upstroke bilaterally, no bruit  Lower Extremity Edema: no lower extremity edema  Abdominal Exam: soft, non-tender, no masses, bowel sounds normal  Liver & Spleen: no organomegaly  Language and Memory: patient responsive and seems to comprehend information  Neurologic Exam: neurological assessment grossly intact      Cardiovascular Studies  Twelve-lead EKG demonstrates normal sinus rhythm, first-degree AV block, PR interval 260 ms, normal QT/QTc intervals, no axis deviation.    Cardiovascular Health Factors  Vitals  BP Readings from Last 3 Encounters:   07/20/21 122/78   05/31/21 129/82   05/23/21 (!) 161/92     Wt Readings from Last 3 Encounters:   07/20/21 75.8 kg (167 lb)   05/23/21 78.5 kg (173 lb)   04/01/21 79.4 kg (175 lb)     BMI Readings from Last 3 Encounters:   07/20/21 26.16 kg/m?   05/31/21 27.10 kg/m?   05/23/21 27.10 kg/m?      Smoking Social History     Tobacco Use   Smoking Status Never   Smokeless Tobacco Never      Lipid Profile Cholesterol   Date Value Ref Range Status   01/03/2020 216 (H) 100 - 199 Final     HDL   Date Value Ref Range Status   01/03/2020 44  Final     LDL   Date Value Ref Range Status   01/03/2020 138 (H) 0 - 99 Final     Triglycerides   Date Value Ref Range Status   01/03/2020 190 (H) 0 - 149 Final      Blood Sugar Hemoglobin A1C   Date Value Ref Range Status   10/08/2018 11.7 (H) 4.8 - 5.6 Final     Glucose   Date Value Ref Range Status   06/14/2021 493 (H) 70 - 105 Final   05/06/2020 145 (H) 65 - 99 Final   10/08/2018 409 (H) 66 - 99 Final     Glucose, POC   Date Value Ref Range Status   05/23/2021 146 (H) 70 - 100 MG/DL Final          Problems Addressed Today  Encounter Diagnoses   Name Primary?   ? Screening for heart disease Yes   ? Single subsegmental pulmonary embolism without acute cor pulmonale (HCC)    ? Chronic deep vein thrombosis (DVT) of femoral vein of left lower extremity (HCC)    ? Preop cardiovascular exam    ? 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 DOE (dyspnea on exertion)    ? Controlled diabetes mellitus type 2 with complications, unspecified whether long term insulin use (HCC)        Assessment and Plan        Assessment:    1. Preoperative cardiovascular evaluation preceding transgender surgical intervention  2.  Diabetes mellitus type 2 requiring insulin-  Historically this has been poorly controlled, patient states that the  most recent hemoglobin A1c is ~11%  Patient is currently on oral antidiabetics and insulin  3.  Primary hypertension-on a combination of amlodipine and metoprolol and under good control  4.  Lower extremity DVT x2-patient is anticoagulated with apixaban  5.  History of subsegmental PE secondary to lower extremity DVT  6.  Family history of premature CAD-patient has 2 brothers that underwent stent placement    Plan:    1.  Continue current medications  2.  I did advise on overall risk factors modification, low-fat, low-cholesterol, low sugar, low carbohydrate diet  3.  Goal hemoglobin A1c less than 7%  4.  Patient will be evaluated with the following:  ? 2D echo Doppler study  ? Regadenoson MPI  5.  We will follow-up on the results of this test and will call the patient with further recommendations    Total Time Today was 40 minutes in the following activities: Preparing to see the patient, Obtaining and/or reviewing separately obtained history, Performing a medically appropriate examination and/or evaluation, Counseling and educating the patient/family/caregiver,  Ordering medications, tests, or procedures, Referring and communication with other health care professionals (when not separately reported), Documenting clinical information in the electronic or other health record, Independently interpreting results (not separately reported) and communicating results to the patient/family/caregiver and Care coordination (not separately reported)         Current Medications (including today's revisions)  ? amitriptyline (ELAVIL) 25 mg tablet Take one tablet by mouth at bedtime daily.   ? amLODIPine (NORVASC) 10 mg tablet Take one tablet by mouth daily.   ? apixaban (ELIQUIS) 5 mg tablet Take one tablet by mouth twice daily.   ? cephalexin (KEFLEX) 500 mg capsule Take one capsule by mouth four times daily.   ? glipiZIDE (GLUCOTROL) 10 mg tablet Take one tablet by mouth daily.   ? JANUVIA 50 mg tablet Take one tablet by mouth daily.   ? LANTUS SOLOSTAR U-100 INSULIN 100 unit/mL (3 mL) injection PEN Inject forty two Units under the skin at bedtime daily.   ? latanoprost (XALATAN) 0.005 % ophthalmic solution instill 1-2 drops in affected eye DAILY AT BEDTIME   ? lisinopriL (ZESTRIL) 10 mg tablet Take one tablet by mouth daily.   ? metFORMIN (GLUCOPHAGE) 1,000 mg tablet Take one-half tablet by mouth twice daily with meals.   ? methylprednisolone (MEDROL) 32 mg tablet Take one tablet by mouth as directed. Take 32mg  by mouth 12 hours before appointment, then take 32mg  by mouth 2 hours before appointment time   ? metoprolol tartrate 25 mg tablet Take one tablet by mouth twice daily.   ? nitrofurantoin monohyd/m-cryst (MACROBID) 100 mg capsule Take one capsule by mouth daily. Take with food.  Indications: genitourinary tract infections   ? omeprazole DR (PRILOSEC) 20 mg capsule Take one capsule by mouth daily before breakfast.   ? ondansetron (ZOFRAN ODT) 4 mg rapid dissolve tablet Dissolve one tablet by mouth as Needed.   ? rosuvastatin (CRESTOR) 20 mg tablet TAKE ONE TABLET BY MOUTH EVERY DAY   ? spironolactone (ALDACTONE) 100 mg tablet Take one tablet by mouth twice daily. Take with food. (Patient taking differently: Take one tablet by mouth daily. Take with food.)   ? TRULICITY 0.75 mg/0.5 mL injection pen every 7 days.

## 2021-07-21 ENCOUNTER — Encounter: Admit: 2021-07-21 | Discharge: 2021-07-21 | Payer: Commercial Managed Care - PPO | Primary: Family

## 2021-07-22 ENCOUNTER — Encounter: Admit: 2021-07-22 | Discharge: 2021-07-22 | Payer: Commercial Managed Care - PPO | Primary: Family

## 2021-07-22 DIAGNOSIS — F640 Transsexualism: Secondary | ICD-10-CM

## 2021-07-22 NOTE — Telephone Encounter
Called pt and confirmed name and DOB. Calling to coordinate care plan for surgical readiness per surgeon request.    Pt unsure of when last A1c was drawn. Next one scheduled for sometime in late summer. Pt agreeable to have drawn early at PCP. This RN will contact PCP about recent labs and/or getting an updated A1c drawn.     [facility] and [facility]. [facility].     Working on cardiac clearance currently. Saw Dr. Avie ArenasHannen on 5/16 and is currently undergoing testing to assess for surgery.    Pt has no other needs at this time.        Called PCP at [phone] and tx to med records. Unsure is request was faxed to clinic for records release prior to this. Requested A1c records.   Most recent A1c drawn in March. Will be faxed to clinic for assessment.

## 2021-07-22 NOTE — Telephone Encounter
Received fax with labs. A1c 14. Will scan into chart. Providers notified via email and pt called to confirm results received.

## 2021-07-29 ENCOUNTER — Encounter: Admit: 2021-07-29 | Discharge: 2021-07-29 | Payer: Commercial Managed Care - PPO | Primary: Family

## 2021-07-29 ENCOUNTER — Ambulatory Visit: Admit: 2021-07-29 | Discharge: 2021-07-29 | Payer: Commercial Managed Care - PPO | Primary: Family

## 2021-07-29 DIAGNOSIS — R0609 Other forms of dyspnea: Secondary | ICD-10-CM

## 2021-07-29 DIAGNOSIS — Z01810 Encounter for preprocedural cardiovascular examination: Secondary | ICD-10-CM

## 2021-07-29 DIAGNOSIS — Z01818 Encounter for other preprocedural examination: Secondary | ICD-10-CM

## 2021-07-29 DIAGNOSIS — Z136 Encounter for screening for cardiovascular disorders: Secondary | ICD-10-CM

## 2021-08-01 ENCOUNTER — Emergency Department
Admit: 2021-08-01 | Discharge: 2021-08-02 | Disposition: A | Discharge status: Home / Self Care | Payer: Commercial Managed Care - PPO

## 2021-08-01 ENCOUNTER — Encounter: Admit: 2021-08-01 | Discharge: 2021-08-01 | Payer: Commercial Managed Care - PPO | Primary: Family

## 2021-08-01 DIAGNOSIS — R339 Retention of urine, unspecified: Secondary | ICD-10-CM

## 2021-08-01 LAB — CBC AND DIFF
ABSOLUTE EOS COUNT: 0 K/UL (ref 0–0.45)
ABSOLUTE LYMPH COUNT: 2 K/UL (ref 1.0–4.8)
BASOPHILS %: 1 % (ref 0–2)
LYMPHOCYTES %: 17 % — ABNORMAL LOW (ref 24–44)
MDW (MONOCYTE DISTRIBUTION WIDTH): 18 (ref <20.7)
MONOCYTES %: 8 % (ref 4–12)
WBC COUNT: 12 K/UL — ABNORMAL HIGH (ref 4.5–11.0)

## 2021-08-01 LAB — BASIC METABOLIC PANEL
ANION GAP: 17 pg — ABNORMAL HIGH (ref 3–12)
ANION GAP: 15 — ABNORMAL HIGH (ref 3–12)
BLD UREA NITROGEN: 26 mg/dL — ABNORMAL HIGH (ref 7–25)
BLD UREA NITROGEN: 20 mg/dL (ref 7–25)
CALCIUM: 10 mg/dL (ref 8.5–10.6)
CALCIUM: 10 mg/dL (ref 8.5–10.6)
CHLORIDE: 94 MMOL/L — ABNORMAL LOW (ref 98–110)
CO2: 16 MMOL/L — ABNORMAL LOW (ref 21–30)
CO2: 18 MMOL/L — ABNORMAL LOW (ref 21–30)
CREATININE: 1.6 mg/dL — ABNORMAL HIGH (ref 0.4–1.00)
CREATININE: 1.3 mg/dL — ABNORMAL HIGH (ref 0.4–1.00)
EGFR: 36 mL/min — ABNORMAL LOW (ref >60)
EGFR: 43 mL/min — ABNORMAL LOW (ref >60)
GLUCOSE,PANEL: 292 mg/dL — ABNORMAL HIGH (ref 70–100)
GLUCOSE,PANEL: 190 mg/dL — ABNORMAL HIGH (ref 70–100)
POTASSIUM: 4.5 MMOL/L (ref 3.5–5.1)
POTASSIUM: 4.1 MMOL/L (ref 3.5–5.1)
SODIUM: 127 MMOL/L — ABNORMAL LOW (ref 137–147)
SODIUM: 132 MMOL/L — ABNORMAL LOW (ref 137–147)

## 2021-08-01 MED ORDER — LIDOCAINE HCL 2 % MM JELP
TOPICAL | 0 refills | Status: AC | PRN
Start: 2021-08-01 — End: ?
  Administered 2021-08-01: 21:00:00 20.000 mL via TOPICAL

## 2021-08-01 MED ORDER — SODIUM CHLORIDE 0.9 % IV SOLP
1000 mL | INTRAVENOUS | 0 refills | Status: CP
Start: 2021-08-01 — End: ?
  Administered 2021-08-01: 23:00:00 1000 mL via INTRAVENOUS

## 2021-08-01 MED ORDER — CEPHALEXIN 500 MG PO CAP
500 mg | ORAL_CAPSULE | Freq: Four times a day (QID) | ORAL | 0 refills | Status: AC
Start: 2021-08-01 — End: ?

## 2021-08-01 NOTE — ED Notes
61 yo F presents to ED with cc of urinary retention.  Pt reports has known history of urinary strictures.  Was seen earlier today in [place] ED with failed attempts to be catheterized.  Pt reports lower abd pain, sweats;

## 2021-08-01 NOTE — ED Notes
61 yo Pt presents to ED with cc of urinary retention.  Pt biologically female at birth, identifies as female.  Pt denies any surgeries related to gender reassignment.  Pt reports that has not voided since 1900 yesterday evening.  Pt reports was seen at [facility] ED this morning, and OSH staff unable to place foley catheter.  Pt unable to self catheterize at home with coude catheter as well.  Bladder scan performed at bedside and pt found to have approximately 543 ml of urine in bladder.  Dr. Derrell Lolling notified of bladder scan results.  Pt alert & oriented x4, pt in cart in lowest locked position, pt placed on spo2/bp monitoring. Piv  established and lab specimens obtained.  Pt with   Medical History:   Diagnosis Date    Deep vein thrombophlebitis of leg, left (HCC) 11/02/2018    Diabetes 1.5, managed as type 2 (HCC)     Diabetes type 2, controlled (HCC) 11/02/2018    Gender dysphoria     Gender dysphoria 11/02/2018    Pulmonary emboli [facility])     Pulmonary emboli (HCC) 11/02/2018    UTI (urinary tract infection) 11/02/2018     Surgical History:   Procedure Laterality Date    ACL RECONSTRUCTION Right     HX BREAST AUGMENTATION Bilateral     HX ROTATOR CUFF REPAIR Right      Awaiting ER provider eval.

## 2021-08-01 NOTE — ED Notes
Attempted to place foley catheter for bladder scan of 543.  Unable to pass 30f foley catheter and coude catheter.  Pt tolerated.  Dr. Derrell Lolling notified via voalte.

## 2021-08-02 NOTE — Consults
[facility] Consult Note  08/01/2021       Patient: Barbara Obrien  MRN: [id number]    Admission Date:  08/01/2021, LOS: 0 days  Admission Diagnosis: No admission diagnoses are documented for this encounter.  Date of Service: Aug 01, 2021    Reason for Consult: urinary retention  Referring Provider: Shelton Silvas, MD  Attending Surgeon: Elizabeth Sauer, MD  Consult Performed by: [place] , MD    ASSESSMENT: 61 y.o. adult with history of PE on Eliquis, DM, gender dysphoria, urethral strictures s/p prior dilations who presented to the emergency room due to urinary retention.    On exam, she had significant phimosis and lichen sclerosis changes.  She has had urethral dilations for this in the past.  Discussed that given her difficulty voiding, I would recommend undergoing this again.  After discussion of the risks, benefits, alternatives, she indicated she would like to proceed.  See separate procedure note for details, but after obtaining informed consent, we proceeded with serial dilation of her distal urethral stricture over a wire to 18Fr, followed by placement of 16 French councill tip catheter with return of clear yellow urine.    PLAN:  - Maintain foley catheter at discharge   > Please provide with catheter supplies, including leg bag, overnight bag  - Send with 3 days keflex to cover instrumentation  - I have messaged for follow up with Dr. Larita Fife in 1 week for foley catheter removal and void trial    Discussed with staff. Thank you for consulting Urology.     [place] , MD  Urology Resident  Please page Urology on call with any questions  __________________________________________________________________________________    HPI: Barbara Obrien is a 61 y.o. adult with history of PE on Eliquis, DM, gender dysphoria, urethral strictures s/p prior dilations who presented to the emergency room due to urinary retention.  She began experiencing difficulty with urination starting about a week ago.  She has only unable to void small amounts since that time.  Yesterday, she went to a local emergency department and they attempted catheter placement, were successful.  She says that they were going to transfer her to [place], however she then voided a large amount with low PVR, so she was discharged home.  She voided well initially, but says that yesterday evening she again began having difficulty, only able to void very small amounts.  Denies fevers, chills, dysuria.    Medical History:   Diagnosis Date   ? Deep vein thrombophlebitis of leg, left (HCC) 11/02/2018   ? Diabetes 1.5, managed as type 2 (HCC)    ? Diabetes type 2, controlled (HCC) 11/02/2018   ? Gender dysphoria    ? Gender dysphoria 11/02/2018   ? Pulmonary emboli (HCC)    ? Pulmonary emboli (HCC) 11/02/2018   ? UTI (urinary tract infection) 11/02/2018       Surgical History:   Procedure Laterality Date   ? ACL RECONSTRUCTION Right    ? HX BREAST AUGMENTATION Bilateral    ? HX ROTATOR CUFF REPAIR Right        Medications:  Scheduled Meds: Continuous Infusions:  PRN and Respiratory Meds:lidocaine hcl PRN      Allergies:  Contrast dye iv, iodine containing [iodinated contrast media]; Iodine and iodide containing products; Sulfa (sulfonamide antibiotics); and Tramadol    Social History     Socioeconomic History   ? Marital status: Widowed   Tobacco Use   ? Smoking status: Never   ?  Smokeless tobacco: Never   Substance and Sexual Activity   ? Alcohol use: Yes     Alcohol/week: 1.0 standard drink of alcohol     Types: 1 Cans of beer per week     Comment: Occasional   ? Drug use: Not Currently   ? Sexual activity: Not Currently       Family History   Problem Relation Age of Onset   ? Cancer Mother    ? Diabetes Mother    ? Cancer Father    ? Heart Attack Brother    ? Heart Attack Brother        Vitals:  Vital Signs: Last Filed In 24 Hours Vital Signs: 24 Hour Range   BP: 150/80 (05/28 1830)  Temp: 36.7 ?C (98.1 ?F) (05/28 1452)  Pulse: 121 (05/28 1452)  Respirations: 19 PER MINUTE (05/28 1452)  SpO2: 97 % (05/28 1830)  O2 Device: None (Room air) (05/28 1452)  SpO2 Pulse: 89 (05/28 1830) BP: (138-197)/(77-92)   Temp:  [36.7 ?C (98.1 ?F)]   Pulse:  [121]   Respirations:  [19 PER MINUTE]   SpO2:  [93 %-100 %]   O2 Device: None (Room air)   Intensity Pain Scale (Self Report): 10 (08/01/21 1504)      Intake/Output:  No intake or output data in the 24 hours ending 08/01/21 1914    Physical Exam:   General: Alert, oriented, cooperative, no distress  Head: Normocephalic, without obvious abnormality, atraumatic  Eyes: EOMI, no scleral icterus  Lungs: Unlabored on RA, symmetric chest rise  Heart: RR  Abdomen: Soft, non-tender, non-distended  GU: Significant phimosis and lichen sclerosis changes  Extremity: No appreciable edema  Neurologic: Grossly intact.     ROS:  A complete review of systems was obtained and was negative except for the symptoms reviewed above.     Lab/Radiology/Other Diagnostic Tests:  Recent Labs     08/01/21  1537   HGB 14.5   HCT 43.0   WBC 12.1*   PLTCT 252   NA 127*   K 4.5   CL 94*   CO2 16*   BUN 26*   CR 1.60*   GLU 292*   CA 10.6     Glucose: (!) 292 (08/01/21 1537)    Active Problems:    * No active hospital problems. *

## 2021-08-02 NOTE — Other
Brief Operative Note    Name: Barbara Obrien is a 61 y.o. adult     DOB: 02/07/1961             MRN#: [id number]  DATE OF OPERATION:     Date:  08/01/2021        Preoperative Dx:   Urinary retention, urethral stricture    Postoperative Dx:  Same    Findings:  1. Significant phimosis, lichen sclerosis changes  2. Dilation over wire to 18Fr  3. Successful placement of 16Fr councill tip catheter with return of clear yellow urine    Indication and description of procedure:    See separate consult note for details, but this is a patient with a history of urethral strictures she was in today with urinary retention and recurrent stricture.  After session of risk, benefits, alternatives, patient elected to undergo dilation of urethral stricture and catheter placement.    After informed consent was obtained, the patient was prepped and draped in the standard sterile fashion.  Exam was notable for significant phimosis and lichen sclerosis changes.  A Glidewire was advanced into the stenotic urethral meatus and into the bladder.  Next, serial urethral dilation was performed starting with 8 [place] and up to 65 [place].  Given the patient only had history of very distal strictures, the urethral dilators were only advanced about 3 cm into meatus.  Next, a 16Fr councill tip catheter was advanced over the wire and into the bladder with return of clear yellow urine.  10 cc was instilled in the balloon, the wire removed, and the catheter connected to drainage bag.  This concluded the procedure.    The patient tolerated the procedure well.  There were no immediate complications.  All instrument counts were correct at the end of the procedure.    Estimated Blood Loss: Minimal    Specimen(s) Removed/Disposition: None    Complications:  None    Implants: None    Drains: Foley    Martinique Donne Baley, MD

## 2021-08-02 NOTE — Unmapped
You were seen in the emergency department for urinary retention.  You were seen by urology who was able to place a Foley catheter.

## 2021-08-02 NOTE — ED Notes
Pt received discharge instructions and prescriptions sent to pt's preferred pharmacy. Pt is AAO4, states she understands and has no further questions at this time. Pt left ED via ambulation with steady gait.

## 2021-08-02 NOTE — ED Notes
Pt report received from [place], RN. No further questions at this time. Will continue to monitor pt.

## 2021-08-02 NOTE — ED Notes
Foley cath placed by urology.  Draining clear yellow urine.

## 2021-08-03 ENCOUNTER — Encounter: Admit: 2021-08-03 | Discharge: 2021-08-03 | Payer: Commercial Managed Care - PPO | Primary: Family

## 2021-08-03 NOTE — Telephone Encounter
-----   Message from Dorris Fetch, MD sent at 08/01/2021  8:36 PM CDT -----  Please call the patient and let him know that both the the echocardiogram and a stress test were unremarkable, did not show blockages, the heart function was normal.  I advised that he continues all current medications.  Advised that he continues to follow-up with his primary care.  He can come back to our office on an as-needed basis.    Thank you      ----- Message -----  From: Lucianne Muss, MD  Sent: 07/29/2021   3:42 PM CDT  To: Dorris Fetch, MD

## 2021-08-03 NOTE — Telephone Encounter
Patient is low risk from a cardiac perspective for his upcoming procedure.  No further testing or office visits are required.  If questions or concerns please call our office.    Dr. Nickolas Madrid

## 2021-08-05 ENCOUNTER — Ambulatory Visit: Admit: 2021-08-05 | Discharge: 2021-08-05 | Payer: Commercial Managed Care - PPO | Primary: Family

## 2021-08-05 ENCOUNTER — Encounter: Admit: 2021-08-05 | Discharge: 2021-08-05 | Payer: Commercial Managed Care - PPO | Primary: Family

## 2021-08-05 DIAGNOSIS — N390 Urinary tract infection, site not specified: Secondary | ICD-10-CM

## 2021-08-05 DIAGNOSIS — F649 Gender identity disorder, unspecified: Secondary | ICD-10-CM

## 2021-08-05 DIAGNOSIS — E139 Other specified diabetes mellitus without complications: Secondary | ICD-10-CM

## 2021-08-05 DIAGNOSIS — I80202 Phlebitis and thrombophlebitis of unspecified deep vessels of left lower extremity: Secondary | ICD-10-CM

## 2021-08-05 DIAGNOSIS — E119 Type 2 diabetes mellitus without complications: Secondary | ICD-10-CM

## 2021-08-05 DIAGNOSIS — I2699 Other pulmonary embolism without acute cor pulmonale: Secondary | ICD-10-CM

## 2021-08-05 NOTE — Telephone Encounter
-----   Message from Ishmael Holter, RN sent at 08/05/2021  2:53 PM CDT -----    ----- Message -----  From: Laurence Aly, MD  Sent: 08/05/2021   2:48 PM CDT  To: Ishmael Holter, RN; Glade Stanford, RN    Let pt know this is normal.

## 2021-08-26 ENCOUNTER — Encounter: Admit: 2021-08-26 | Discharge: 2021-08-26 | Payer: Commercial Managed Care - PPO | Primary: Family

## 2021-09-06 ENCOUNTER — Encounter: Admit: 2021-09-06 | Discharge: 2021-09-06 | Payer: Commercial Managed Care - PPO | Primary: Family

## 2021-09-06 ENCOUNTER — Ambulatory Visit: Admit: 2021-09-06 | Discharge: 2021-09-07 | Payer: Commercial Managed Care - PPO | Primary: Family

## 2021-09-06 DIAGNOSIS — I80202 Phlebitis and thrombophlebitis of unspecified deep vessels of left lower extremity: Secondary | ICD-10-CM

## 2021-09-06 DIAGNOSIS — F649 Gender identity disorder, unspecified: Secondary | ICD-10-CM

## 2021-09-06 DIAGNOSIS — E139 Other specified diabetes mellitus without complications: Secondary | ICD-10-CM

## 2021-09-06 DIAGNOSIS — F640 Transsexualism: Secondary | ICD-10-CM

## 2021-09-06 DIAGNOSIS — E119 Type 2 diabetes mellitus without complications: Secondary | ICD-10-CM

## 2021-09-06 DIAGNOSIS — I2699 Other pulmonary embolism without acute cor pulmonale: Secondary | ICD-10-CM

## 2021-09-06 DIAGNOSIS — N390 Urinary tract infection, site not specified: Secondary | ICD-10-CM

## 2021-09-10 ENCOUNTER — Encounter: Admit: 2021-09-10 | Discharge: 2021-09-10 | Payer: Commercial Managed Care - PPO | Primary: Family

## 2021-10-25 ENCOUNTER — Encounter: Admit: 2021-10-25 | Discharge: 2021-10-25 | Payer: Commercial Managed Care - PPO | Primary: Family

## 2021-12-16 ENCOUNTER — Encounter: Admit: 2021-12-16 | Discharge: 2021-12-16 | Payer: Commercial Managed Care - PPO | Primary: Family

## 2022-01-04 ENCOUNTER — Encounter: Admit: 2022-01-04 | Discharge: 2022-01-04 | Payer: Commercial Managed Care - PPO | Primary: Family

## 2022-03-11 ENCOUNTER — Encounter: Admit: 2022-03-11 | Discharge: 2022-03-11 | Payer: Commercial Managed Care - PPO | Primary: Family

## 2022-03-18 ENCOUNTER — Encounter: Admit: 2022-03-18 | Discharge: 2022-03-18 | Payer: Commercial Managed Care - PPO | Primary: Family

## 2022-03-22 ENCOUNTER — Encounter: Admit: 2022-03-22 | Discharge: 2022-03-22 | Payer: Commercial Managed Care - PPO | Primary: Family

## 2022-03-30 ENCOUNTER — Encounter: Admit: 2022-03-30 | Discharge: 2022-03-30 | Payer: Commercial Managed Care - PPO | Primary: Family

## 2022-04-05 ENCOUNTER — Encounter: Admit: 2022-04-05 | Discharge: 2022-04-05 | Payer: Commercial Managed Care - PPO | Primary: Family

## 2022-05-03 ENCOUNTER — Encounter: Admit: 2022-05-03 | Discharge: 2022-05-03 | Payer: Commercial Managed Care - PPO | Primary: Family

## 2022-05-16 ENCOUNTER — Encounter: Admit: 2022-05-16 | Discharge: 2022-05-16 | Payer: Commercial Managed Care - PPO | Primary: Family

## 2022-05-16 ENCOUNTER — Inpatient Hospital Stay: Admit: 2022-05-16 | Discharge: 2022-05-16 | Payer: Commercial Managed Care - PPO | Primary: Family

## 2022-05-16 DIAGNOSIS — F649 Gender identity disorder, unspecified: Secondary | ICD-10-CM

## 2022-05-18 ENCOUNTER — Encounter: Admit: 2022-05-18 | Discharge: 2022-05-18 | Payer: Commercial Managed Care - PPO | Primary: Family

## 2022-05-31 ENCOUNTER — Encounter: Admit: 2022-05-31 | Discharge: 2022-05-31 | Payer: 59 | Primary: Family

## 2022-07-05 ENCOUNTER — Encounter: Admit: 2022-07-05 | Discharge: 2022-07-05 | Payer: 59 | Primary: Family

## 2022-07-12 ENCOUNTER — Encounter: Admit: 2022-07-12 | Discharge: 2022-07-12 | Payer: 59 | Primary: Family

## 2022-07-14 ENCOUNTER — Ambulatory Visit: Admit: 2022-07-14 | Discharge: 2022-07-15 | Payer: 59 | Primary: Family

## 2022-07-15 ENCOUNTER — Encounter: Admit: 2022-07-15 | Discharge: 2022-07-15 | Payer: 59 | Primary: Family

## 2022-07-15 ENCOUNTER — Ambulatory Visit: Admit: 2022-07-15 | Discharge: 2022-07-15 | Payer: 59 | Primary: Family

## 2022-07-19 ENCOUNTER — Encounter: Admit: 2022-07-19 | Discharge: 2022-07-19 | Payer: 59 | Primary: Family

## 2022-07-28 ENCOUNTER — Encounter: Admit: 2022-07-28 | Discharge: 2022-07-28 | Payer: 59 | Primary: Family

## 2022-08-08 ENCOUNTER — Encounter: Admit: 2022-08-08 | Discharge: 2022-08-08 | Payer: 59 | Primary: Family

## 2022-08-09 ENCOUNTER — Ambulatory Visit: Admit: 2022-08-09 | Discharge: 2022-08-10 | Payer: 59 | Primary: Family

## 2022-08-09 ENCOUNTER — Encounter: Admit: 2022-08-09 | Discharge: 2022-08-09 | Payer: 59 | Primary: Family

## 2022-08-09 DIAGNOSIS — E119 Type 2 diabetes mellitus without complications: Secondary | ICD-10-CM

## 2022-08-09 DIAGNOSIS — F649 Gender identity disorder, unspecified: Secondary | ICD-10-CM

## 2022-08-09 DIAGNOSIS — I2699 Other pulmonary embolism without acute cor pulmonale: Secondary | ICD-10-CM

## 2022-08-09 DIAGNOSIS — N390 Urinary tract infection, site not specified: Secondary | ICD-10-CM

## 2022-08-09 DIAGNOSIS — I80202 Phlebitis and thrombophlebitis of unspecified deep vessels of left lower extremity: Secondary | ICD-10-CM

## 2022-08-09 DIAGNOSIS — E139 Other specified diabetes mellitus without complications: Secondary | ICD-10-CM

## 2022-08-09 NOTE — Progress Notes
Initial Consultation for Female-to-Female Gender Affirming Genital Surgery     Preferred Name: Barbara Obrien   Pronouns: she/her/hers      Chief Complaint:  Gender Affirming Surgery Consult     HPI:     Barbara Obrien is a 62 y.o. transgender or gender nonconforming person interested in alleviating gender dysphoria through the feminizing of their body with gender affirming genital surgery.     Interested in: vulvoplasty  Scheduled for surgery in August 2024      Started hormones: 2013  Started living full time as chosen gender: 2014  Hair removal: none  BMI: Body mass index is 25.82 kg/m?Marland Kitchen  Last tobacco: non smoker   Surgical support plan: family - her daughter and her good friend   Is patient actively engaged in therapy: No  Letters:   Letter #1 Conver PhD 07/14/22 approved   Letter #2 Nishimuta PhD 07/1022 approved    Lives in [place].   Works as a Web designer at [employer].   Lives with daughter    Active medical issues:     T2DM - finally got this controlled!   Managed by PCP   Recent A1c 6.1% on 07/12/22 - scanned into Media   Cardiology visit at [facility] on 07/20/2021.  Nuclear stress report was normal with no evidence of myocardial ischemia.  No ihgh risk prognostic indicators    Lichen sclerosus and urethral stricture s/p multiple prior dilations, recurrent UTIs, currently seeing Dr. Larita Fife. Previously saw Dr. Linda Hedges, MD a urologist at [facility].     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 On daily eliquis.   - saw hematology at [facility] in [place]. Joe, diagnosed a hereditary thrombophilia       PCP: Darden Dates, APRN at [facility]           Past Medical History:   Diagnosis Date    Deep vein thrombophlebitis of leg, left (HCC) 11/02/2018    Diabetes 1.5, managed as type 2 (HCC)     Diabetes type 2, controlled (HCC) 11/02/2018    Gender dysphoria     Gender dysphoria 11/02/2018    Pulmonary emboli (HCC)     Pulmonary emboli (HCC) 11/02/2018    UTI (urinary tract infection) 11/02/2018       Surgical History:   Procedure Laterality Date    ACL RECONSTRUCTION Right     HX BREAST AUGMENTATION Bilateral     HX ROTATOR CUFF REPAIR Right        Social History     Tobacco Use    Smoking status: Never    Smokeless tobacco: Never   Substance Use Topics    Alcohol use: Not Currently       Family History   Problem Relation Name Age of Onset    Cancer Mother      Diabetes Mother      Cancer Father bladder cancer     Heart Attack Brother      Heart Attack Brother           Allergies:  Allergies   Allergen Reactions    Contrast Dye Iv, Iodine Containing [Iodinated Contrast Media] HIVES    Iodine And Iodide Containing Products HIVES    Sulfa (Sulfonamide Antibiotics) HIVES    Tramadol UNKNOWN       Current Outpatient Medications   Medication Instructions    amitriptyline (ELAVIL) 25 mg, Oral, AT BEDTIME DAILY    amLODIPine (NORVASC) 10  mg, Oral, DAILY    apixaban (ELIQUIS) 5 mg, Oral, TWICE DAILY    glipiZIDE (GLUCOTROL) 10 mg, Oral, DAILY    LANTUS SOLOSTAR U-100 INSULIN 42 Units, Subcutaneous, AT BEDTIME DAILY    lisinopriL (ZESTRIL) 10 mg, Oral, DAILY    metoprolol tartrate 25 mg, Oral, TWICE DAILY    nitrofurantoin monohyd/m-cryst (MACROBID) 100 mg capsule 100 mg, Oral, DAILY, Take with food.    omeprazole DR (PRILOSEC) 20 mg, Oral, DAILY BEFORE BREAKFAST    rosuvastatin (CRESTOR) 20 mg tablet TAKE ONE TABLET BY MOUTH EVERY DAY    spironolactone (ALDACTONE) 100 mg, Oral, TWICE DAILY, Take with food.     TRULICITY 0.75 mg/0.5 mL injection pen EVERY  7 DAYS                  Assessment and Plan:     Gender Dysphoria     Reviewed with patient the different surgical techniques available for vaginoplasty.  At [facility] we perform an inverted peno-scrotal flap vaginoplasty.  Reviewed the advantages and disadvantages of this technique. Discussed our multidisciplinary approach combining the strengths of urology, plastic surgery and gynecologic surgeons. Specifically discussed the need to de-epithelialize the scrotal skin and base of penis. Reviewed limitations of a procedure to achieve ?ideal? results. Discussed the inherent risks and possible complications of vaginoplasty. In my discussion with patient, attempt was made to ensure that she has a realistic expectation of outcomes. Patient is aware that genital surgery is irreversible.      Reviewed +/- vaginal canal versus only external labiaplasty with zero depth vagina  Sexual function post op - may not orgasm  Need to stay on hormones for bone health until menopausal age  Need to dilate the vaginal canal for life  Permanent sterility    Informed consent discussion about feminizing genital surgery.     at [facility] we perform a penile inversion technique for vulvoplasty and vaginoplasty   our surgical team is multidisciplinary and involves gynecology, plastic surgery and urology  we follow WPATH guidelines in the care of all gender affirming surgical patients.   FYI - insurance companies often have their own set of requirements to cover the procedure. These requirements are often very particular  the pillars of the physician-patient relationship are trust and respect. If there is erosion of either of these tenants, then the behavior will be discussed and we may decide not to perform surgery or continue the physician-patient relationship.  we will take post operative pictures for your medical record   we may discuss your care with the mental health professionals that provided the letters in support of genital surgery      reviewed the difference between vulvoplasty (aka 0-depth vagina) and full depth vaginoplasty   goals of surgery: safe, aesthetically accurate, functional female genitalia   we cannot guarantee any specific aesthetic result  this surgery is sterilizing (if you are not already sterile). You will not be able to produce biologic children after this surgery  general surgical risks include (not limited to): bleeding, pain, infection, would healing problems, allergic reaction, anesthesia complication such as breathing or heart problems.  the neovagina requires lifelong dilation to maintain patency. If you stop dilating, the vagina will scar closed  we strongly recommend presurgical hair removal on the scrotal skin before surgery to prevent hair growth inside the vaginal canal  after gonadectomy, you need to stay on hormones until menopause age for bone health    Risks specific to feminizing genital surgery:  sexual dysfunction such as inability to orgasm  urinary dysfunction such as leaking urine or incontinence  discontent with aesthetic results  need for surgical revision  loss of vaginal skin graft   rectal injury resulting in possible ostomy and possible rectovaginal fistula   rectal sphincter injury resulting in fecal incontinence  pelvic and/or genital pain  vaginal stenosis if you do not or cannot dilate the vagina   granulation tissue inside the vagina   regret     Reviewed our surgical preparation process.     VULVOPLASTY       To Do List:       Hold eliquis 48 hours prior to surgery, restart 24 hours post op  (restart her eliquis instead of lovenox)  (She did this prior to knee scope and it worked ok)    Goal to keep A1c <7% pre and post surgery   - recent stress test normal.     Goal surgery- scheduled August 2024     Plastics : F 10/03/22  Urology : seen             Visit billed by time. Total Time 60 minutes. Estimated face-to-face counseling time >50% of visit regarding above issues.

## 2022-08-16 ENCOUNTER — Encounter: Admit: 2022-08-16 | Discharge: 2022-08-16 | Payer: 59 | Primary: Family

## 2022-08-23 ENCOUNTER — Encounter: Admit: 2022-08-23 | Discharge: 2022-08-23 | Payer: 59 | Primary: Family

## 2022-08-31 ENCOUNTER — Encounter: Admit: 2022-08-31 | Discharge: 2022-08-31 | Payer: 59 | Primary: Family

## 2022-08-31 NOTE — Telephone Encounter
You had asked for Milia's note from her hematologist.   synopsis below  records in o2 under media tab.     Received last note from 11/11/20   Patient h/o recurrent lower left extremity DVT. Had CVT in 06/2018 while on estrogen. Placed on Anticoagulation for 3 months then discontinued. DVT again 04/2019 same leg and placed on Eliquis 5mg  bid.   Pt + for Factor V leiden heterozygous. Antiphospholipid panel negative   No clear indicator for second clot. Had traveled recently but only 8 hrs.  recommended Eliquis 2.5 bid Did not extend because of FVL but because or recurrent clot.   Very mild leukocytosis no anemia or thrombocytopenia.   Wanted to see patient back in 6 months.  She did not return.     Agnes Havana, RN BSN   Program Coordinator   [facility]

## 2022-08-31 NOTE — Telephone Encounter
spoke to pt to follow on her hematology records.  Reports that she did not follow up in 6 months per her plan with hematologist. Records sent to Dr. Wallace Cullens to review. Agnes Clearfield, RN BSN  Program Coordinator  [facility]

## 2022-09-05 ENCOUNTER — Encounter: Admit: 2022-09-05 | Discharge: 2022-09-05 | Payer: 59 | Primary: Family

## 2022-09-05 DIAGNOSIS — Z01818 Encounter for other preprocedural examination: Secondary | ICD-10-CM

## 2022-09-12 ENCOUNTER — Encounter: Admit: 2022-09-12 | Discharge: 2022-09-12 | Payer: 59 | Primary: Family

## 2022-09-12 ENCOUNTER — Ambulatory Visit: Admit: 2022-09-12 | Discharge: 2022-09-12 | Payer: 59 | Primary: Family

## 2022-09-12 DIAGNOSIS — E119 Type 2 diabetes mellitus without complications: Secondary | ICD-10-CM

## 2022-09-12 DIAGNOSIS — I80202 Phlebitis and thrombophlebitis of unspecified deep vessels of left lower extremity: Secondary | ICD-10-CM

## 2022-09-12 DIAGNOSIS — I2699 Other pulmonary embolism without acute cor pulmonale: Secondary | ICD-10-CM

## 2022-09-12 DIAGNOSIS — F649 Gender identity disorder, unspecified: Secondary | ICD-10-CM

## 2022-09-12 DIAGNOSIS — E139 Other specified diabetes mellitus without complications: Secondary | ICD-10-CM

## 2022-09-12 DIAGNOSIS — I82512 Chronic embolism and thrombosis of left femoral vein: Secondary | ICD-10-CM

## 2022-09-12 DIAGNOSIS — F640 Transsexualism: Secondary | ICD-10-CM

## 2022-09-12 DIAGNOSIS — R0609 Other forms of dyspnea: Secondary | ICD-10-CM

## 2022-09-12 DIAGNOSIS — I2693 Single subsegmental pulmonary embolism without acute cor pulmonale: Secondary | ICD-10-CM

## 2022-09-12 DIAGNOSIS — Z01810 Encounter for preprocedural cardiovascular examination: Secondary | ICD-10-CM

## 2022-09-12 DIAGNOSIS — Z136 Encounter for screening for cardiovascular disorders: Secondary | ICD-10-CM

## 2022-09-12 DIAGNOSIS — E118 Type 2 diabetes mellitus with unspecified complications: Secondary | ICD-10-CM

## 2022-09-12 DIAGNOSIS — N390 Urinary tract infection, site not specified: Secondary | ICD-10-CM

## 2022-09-12 NOTE — Progress Notes
Cardiovascular Medicine       Date of Service: 09/12/2022      HPI     Barbara Obrien is a 62 y.o. adult who was seen today in the Cardiovascular Medicine Clinic at [facility].     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  She presents today for preoperative cardiac restratification prior to planned vulvoplasty.     She is doing well.  Denies any chest pain, shortness of breath, lightheadedness, palpitations or syncope.  She has lost about 20 pounds over the past year and has noticed a significant improvement in her exercise capacity.  She was previously on hormone supplementation but this was stopped in light of her VTE.  She is currently taking spironolactone.  Reports blood pressure has been well-controlled over the past year.  She has had no significant bleeding issues with the apixaban.           ECG: Normal sinus rhythm.    Transthoracic echocardiogram: 07/29/2021  Left Ventricle: The left ventricular size is normal. Concentric remodeling. Mild basal septal thickening. The left ventricular systolic function is normal. The ejection fraction by Simpson's biplane method is 56%. There are no segmental wall motion abnormalities. Normal left ventricular diastolic function. Normal left atrial pressure.  Right Ventricle: Ventricle not well seen. The right ventricular systolic function is normal.  No hemodynamically significant abnormalities.  Normal central venous pressure    Stress test: 07/29/2021   This study is normal with no evidence of significant myocardial ischemia. Left ventricular systolic function is normal. There are no high risk prognostic indicators present.  The pharmacologic ECG portion of the study is negative for ischemia.         Assessment & Plan   62 y.o. adult patient with the following medical problems:    Hypertension.  Hyperlipidemia.  Diabetes.  History of venous thromboembolism.  Encounter for preoperative cardiac restratification.    Clinically doing well, NYHA class I with no limiting symptoms.  Recent echocardiogram and stress testing has showed normal left ventricular systolic function and no significant inducible ischemia.  Risk factors appear well-controlled, blood pressure has been well-controlled over the past year and her recent lipid panel shows LDL and triglycerides at goal.  She will be considered low cardiac risk for upcoming planned vulvoplasty.  RCRI: Class II risk (6% 30-day risk of death, myocardial infarction or cardiac arrest)  She is currently on apixaban for history of venous thromboembolism and history of thrombophilia.  This was prescribed to her by hematologist, recommend obtaining further recommendations from hematology regarding perioperative management of anticoagulation.    Return to clinic in 1 year at [facility].         Past Medical History  Patient Active Problem List    Diagnosis Date Noted    Gender dysphoria 04/01/2021    Preoperative cardiovascular examination 11/09/2018     07/29/21 - 2D + DOPPLER ECHO: Left Ventricle: The left ventricular size is normal. Concentric remodeling. Mild basal septal thickening. The left ventricular systolic function is normal. The ejection fraction by Simpson's biplane method is 56%. There are no segmental wall motion abnormalities. Normal left ventricular diastolic function. Normal left atrial pressure. Right Ventricle: Ventricle not well seen. The right ventricular systolic function is normal. No hemodynamically significant abnormalities. Normal central venous pressure  07/29/21 - Procedure: ADAC MULTI GATED SESTAMIBI REGADENOSON MPI STRESS TEST: Left  Ventricular Ejection Fraction (post stress, in the resting state) =  75 %. Left Ventricular End Diastolic Volume: 49 mL. This study is normal with no evidence of significant myocardial ischemia. Left ventricular systolic function is normal. There are no high risk prognostic indicators present.  The pharmacologic ECG portion of the study is negative for ischemia.        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     04/27/2020 US Duplex Venous Reflux Study Bil, [facility].  LLE:  Nonocclusive thrombus seen within left popliteal vein and within mid to distal aspect of duplicated superficial femoral vein.  Chronic DVT.  Venous reflux study with evidence of minimal reflux within the left superficial femoral vein.         I reviewed and confirmed this patient's problem list, active medications, allergies, and past medical, social, family & tobacco histories.     Review of Systems  14 point review of systems negative except as above.    Vitals:    09/12/22 0917   BP: (!) 147/72   BP Source: Arm, Left Upper   Pulse: 62   SpO2: 100%   O2 Device: None (Room air)   PainSc: Zero   Weight: 68 kg (150 lb)   Height: 170.2 cm (5' 7)     Body mass index is 23.49 kg/m?Marland Kitchen     Physical Exam  General Appearance: no acute distress  HEENT: EOMI, mucous membranes moist, oropharynx is clear  Neck Veins: neck veins are flat & not distended  Carotid Arteries: no bruits  Chest Inspection: chest is normal in appearance  Auscultation/Percussion: lungs clear to auscultation, no rales, rhonchi, or wheezing  Cardiac Rhythm: regular rhythm & normal rate  Cardiac Auscultation: Normal S1 & S2, no S3 or S4, no rub  Murmurs: no cardiac murmurs  Abdominal Exam: soft, non-tender, normal bowel sounds, no masses or bruits  Abdominal aorta: nonpalpable   Liver & Spleen: no organomegaly  Extremities: no lower extremity edema; palpable distal pulses  Skin: warm & intact  Neurologic Exam: oriented to time, place and person; no focal neurologic deficits       Cardiovascular Studies  07/29/21   2D + DOPPLER ECHO   Result Value Ref Range    BSA 1.87 m2    LVIDD 3.4 3.8 - 5.2 cm    IVS 1.2 0.6 - 0.9 cm    PW 1 0.6 - 0.9 cm    LVIDS 2.4 2.2 - 3.5 cm    FS 29.41 28 - 44 %    Teichholtz 50.00 %    LA volume 28 22 - 52 mL    Sinus 3.8 2.4 - 3.6 cm    Ascending aorta 3.3 cm    LV mass 114 67 - 162 g    LA size 3.3 2.7 - 3.8 cm    RWT 0.59 <=0.42    AV peak velocity 1.0 m/s    Aortic valve area = 3.20 cm2    AV index (native) 1.10     E/A ratio 0.74     TDI lateral e' 0.094 m/s    E wave  decelartion time 275.0 msec    LVOT diameter 2.0 cm    LVOT area 3.14 cm2    LVOT peak vel 1.1 m/s    LVOT peak VTI 23.9 cm    Ao VTI 23.5 cm    LVOT stroke volume 75.08 cm3    Lateral E/E' ratio 9.36     Right Heart Systolic TDI S' 1.0 m/s    Right Heart Systolic Mmode TAPSE 1.9 >1.7 cm    MV Peak E Vel PW 0.880 m/s    MV Peak A Vel 1.190 m/s    Left Atrium Index 14.97 16 - 34 mL/m2    Cardiology Ultrasound Machine [employer] Epiq     Left Ventricle Mass Index 61 43 - 95 g/m2    Left Ventricle Diastolic Volume 91 46 - 106 mL    Left Ventricle Diastolic Volume Index 49 29 - 61 mL/m2    Left Ventricle Systolic Volume 40 14 - 42 mL    Left Ventricle Systolic Volume Index 21 8 - 24 mL/m2    TDI Medial e' 0.062 m/s    Medial E/E' ratio 14.19     TV rest pulmonary artery pressure 24 mmHg    TR PEAK VELOCITY 2.3 m/s    RV SYSTOLIC PRESSURE 21     RA PRESSURE 3     SIMPSON'S BIPLANE EF 56 %        Cardiovascular Health Factors  Vitals BP Readings from Last 3 Encounters:   09/12/22 (!) 147/72   08/05/21 123/80   08/01/21 (!) 148/86     Wt Readings from Last 3 Encounters:   09/12/22 68 kg (150 lb)   08/09/22 70.3 kg (155 lb)   08/05/21 75.3 kg (166 lb)     BMI Readings from Last 3 Encounters:   09/12/22 23.49 kg/m?   08/09/22 24.28 kg/m?   08/05/21 26.00 kg/m? Smoking Social History     Tobacco Use   Smoking Status Never   Smokeless Tobacco Never      Lipid Profile Cholesterol   Date Value Ref Range Status   01/03/2020 216 (H) 100 - 199 Final     HDL   Date Value Ref Range Status   01/03/2020 44  Final     LDL   Date Value Ref Range Status   01/03/2020 138 (H) 0 - 99 Final     Triglycerides   Date Value Ref Range Status   01/03/2020 190 (H) 0 - 149 Final      Blood Sugar Hemoglobin A1C   Date Value Ref Range Status   10/08/2018 11.7 (H) 4.8 - 5.6 Final     Glucose   Date Value Ref Range Status   08/01/2021 190 (H) 70 - 100 MG/DL Final   16/12/9602 540 (H) 70 - 100 MG/DL Final   98/01/9146 829 (H) 70 - 105 Final     Glucose, POC   Date Value Ref Range Status   05/23/2021 146 (H) 70 - 100 MG/DL Final        ASCVD Risk Assessment:     ASCVD 10-year risk calculated: The 10-year ASCVD risk score (Arnett DK, et al., 2019) is: 11.9%*    Values used to calculate the score:      Age: 32 years      Sex: Female      Is Non-Hispanic African American: No      Diabetic: Yes      Tobacco smoker: No  Systolic Blood Pressure: 147 mmHg      Is BP treated: No      HDL Cholesterol: 44 mg/dL*      Total Cholesterol: 216 mg/dL*      * - Cholesterol units were assumed for this score calculation     LDL 70-189, if ASCVD 10-y risk is >7.5%, high to moderate-intensity statin therapy is recommended  Diabetes with ASCVD 10-y risk >7.5%, high-intensity statin therapy is recommended.  Diabetes with ASCVD 10-y risk <7.5%, moderate-intensity statin therapy is recommended.      Current Medications (including today's revisions)   amitriptyline (ELAVIL) 25 mg tablet Take one tablet by mouth at bedtime daily.    amLODIPine (NORVASC) 10 mg tablet Take one tablet by mouth daily.    apixaban (ELIQUIS) 5 mg tablet Take one tablet by mouth twice daily.    glipiZIDE (GLUCOTROL) 10 mg tablet Take one tablet by mouth daily.    LANTUS SOLOSTAR U-100 INSULIN 100 unit/mL (3 mL) injection PEN Inject forty two Units under the skin at bedtime daily.    lisinopriL (ZESTRIL) 10 mg tablet Take one tablet by mouth daily.    metoprolol tartrate 25 mg tablet Take one tablet by mouth twice daily.    nitrofurantoin monohyd/m-cryst (MACROBID) 100 mg capsule Take one capsule by mouth daily. Take with food.  Indications: genitourinary tract infections    omeprazole DR (PRILOSEC) 20 mg capsule Take one capsule by mouth daily before breakfast.    rosuvastatin (CRESTOR) 20 mg tablet TAKE ONE TABLET BY MOUTH EVERY DAY    spironolactone (ALDACTONE) 100 mg tablet Take one tablet by mouth twice daily. Take with food. (Patient taking differently: Take one tablet by mouth daily. Take with food.)    TRULICITY 0.75 mg/0.5 mL injection pen every 7 days.         Orpah Cobb MD  Cardiovascular Medicine.

## 2022-09-12 NOTE — Patient Instructions
Follow-Up:    -Thank you for allowing [place] to participate in your care today. Your After Visit Summary is being completed by Lafern Brinkley RN.    -We would like you to follow up in 1 year with Orpah Cobb, MD  -The schedule is released approximately 4-5 months in advance. You will be called by our scheduling department to make an appointment and you will also receive a notification via MyChart to self-schedule.  However, if you would like to call to make this appointment, please call [phone].        Changes From Today's Office Visit   We will send the note from Dr. Jeneen Montgomery to your surgical team regarding clearance.    Contacting our office:    -Business Hours: Monday-Friday, 8:00 am-4:30 pm (excluding Holidays).     -For medical questions or concerns, please send [place] a message through your MyChart account or call the [facility] team nursing triage line at [phone]. Please leave a detailed message with your name, date of birth, and reason for your call.  If your message is received before 3:30pm, every effort will be made to call you back the same day.  Please allow time for [place] to review your chart prior to call back.     -For medication refills please start by contacting your pharmacy. You can also send [place] a prescription question through your MyChart or call the nurse triage line above.     -Should you have an immediate need of the weekend/nights and holidays, please call our on-call triage line at [phone].    Doylene Canning team fax number: [phone]    -You may receive a survey in the upcoming weeks from [facility]. Your feedback is important to [place] and helps [place] continue to improve patient care and patient satisfaction.     -Please feel free to call our Financial Department at [phone] with any questions or concerns about estimated cost of testing or imaging ordered today. We are happy to provide CPT codes upon request.    Results & Testing Follow Up:    -Please allow 5-7 business days for the results of any testing to be reviewed. Please call our office if you have not heard from a nurse within this time frame.    -Should you choose to complete testing at an outside facility, please contact our office after completion of testing so that we can ensure that we have received results for your provider to review.    Lab and test results:  As a part of the CARES act, starting 06/06/2019, some results will be released to you via MyChart immediately and automatically.  You may see results before your provider sees them; however, your provider will review all these results and then they, or one of their team, will notify you of result information and recommendations.   Critical results will be addressed immediately, but otherwise, please allow [place] time to get back with you prior to you reaching out to [place] for questions.  This will usually take about 72 hours for labs and 5-7 days for procedure test results.

## 2022-09-12 NOTE — Progress Notes
Received cardiologist clearance for this patient today. She will be considered low cardiac risk for upcoming planned vulvoplasty.  RCRI: Class II risk (6% 30-day risk of death, myocardial infarction or cardiac arrest)  She is currently on apixaban for history of venous thromboembolism and history of thrombophilia.  This was prescribed to her by hematologist, recommend obtaining further recommendations from hematology regarding perioperative management of.anticoagulation.                                                                                                                                                                                                                                                                                                                                                                                    We received her last hematology notes from 2022 Factor v Leiden heterozygous  Anticoag continue due to her recent history of blood clot  Mild leukocytosis return in 6 mos.  She did not.  I asked the patient again today to see hematology.     She is scheduled for surgery 11/02/22 do you need anything else?  Agnes [place], RN BSN  Program Coordinator  [facility]

## 2022-09-12 NOTE — Telephone Encounter
spoke to patient that we had received her cardiology clearance.  Also  reminded patient we are waiting on her note from hematology. Stated she had not contacted them but would.  Dr. Wallace Cullens aware of status.  Barbara Mango, RN BSN  Program Coordinator  [facility]

## 2022-09-12 NOTE — Progress Notes
Office visit note faxed to Dr. Jimmye Norman at [phone]

## 2022-09-13 ENCOUNTER — Encounter: Admit: 2022-09-13 | Discharge: 2022-09-13 | Payer: 59 | Primary: Family

## 2022-09-13 DIAGNOSIS — Z7689 Persons encountering health services in other specified circumstances: Secondary | ICD-10-CM

## 2022-09-13 NOTE — Progress Notes
Spoke to patient concerning her follow up appt with hematologist. She attempted to schedule and her provider is no longer in practice. Would like to establish at [facility].  Spoke to Hematology nurse navigator explained situation that patient was having surgery 8/21 and needed follow up.  Stated she did not know if they would be able to see patient to establish prior to surgery.  Their next available appt was not until September.  With no other openings at this time would you like to reschedule patient until 2025?    Agnes Muscoy, RN BSN  Program Coordinator  [facility]

## 2022-09-20 ENCOUNTER — Encounter: Admit: 2022-09-20 | Discharge: 2022-09-20 | Payer: 59 | Primary: Family

## 2022-09-26 ENCOUNTER — Encounter: Admit: 2022-09-26 | Discharge: 2022-09-26 | Payer: 59 | Primary: Family

## 2022-09-26 ENCOUNTER — Ambulatory Visit: Admit: 2022-09-26 | Discharge: 2022-09-26 | Payer: 59 | Primary: Family

## 2022-09-26 DIAGNOSIS — E785 Hyperlipidemia, unspecified: Secondary | ICD-10-CM

## 2022-09-26 DIAGNOSIS — N390 Urinary tract infection, site not specified: Secondary | ICD-10-CM

## 2022-09-26 DIAGNOSIS — I80202 Phlebitis and thrombophlebitis of unspecified deep vessels of left lower extremity: Secondary | ICD-10-CM

## 2022-09-26 DIAGNOSIS — F649 Gender identity disorder, unspecified: Secondary | ICD-10-CM

## 2022-09-26 DIAGNOSIS — Z01818 Encounter for other preprocedural examination: Secondary | ICD-10-CM

## 2022-09-26 DIAGNOSIS — F640 Transsexualism: Secondary | ICD-10-CM

## 2022-09-26 DIAGNOSIS — K3184 Gastroparesis: Secondary | ICD-10-CM

## 2022-09-26 DIAGNOSIS — N183 CKD (chronic kidney disease), stage III (HCC): Secondary | ICD-10-CM

## 2022-09-26 DIAGNOSIS — I2699 Other pulmonary embolism without acute cor pulmonale: Secondary | ICD-10-CM

## 2022-09-26 DIAGNOSIS — E139 Other specified diabetes mellitus without complications: Secondary | ICD-10-CM

## 2022-09-26 DIAGNOSIS — Z8639 Personal history of other endocrine, nutritional and metabolic disease: Secondary | ICD-10-CM

## 2022-09-26 DIAGNOSIS — E119 Type 2 diabetes mellitus without complications: Secondary | ICD-10-CM

## 2022-09-26 DIAGNOSIS — D759 Disease of blood and blood-forming organs, unspecified: Secondary | ICD-10-CM

## 2022-09-26 LAB — COMPREHENSIVE METABOLIC PANEL
ALBUMIN: 4.5 g/dL (ref 3.5–5.0)
ALK PHOSPHATASE: 70 U/L (ref 25–110)
ALT: 11 U/L (ref 7–56)
ANION GAP: 8 (ref 3–12)
AST: 19 U/L (ref 7–40)
BLD UREA NITROGEN: 22 mg/dL (ref 7–25)
CALCIUM: 9.6 mg/dL (ref 8.5–10.6)
CHLORIDE: 99 MMOL/L (ref 98–110)
CO2: 25 MMOL/L (ref 21–30)
CREATININE: 1.4 mg/dL — ABNORMAL HIGH (ref 0.4–1.00)
EGFR: 40 mL/min — ABNORMAL LOW (ref >60)
GLUCOSE,PANEL: 164 mg/dL — ABNORMAL HIGH (ref 70–100)
POTASSIUM: 5.3 MMOL/L — ABNORMAL HIGH (ref 3.5–5.1)
SODIUM: 132 MMOL/L — ABNORMAL LOW (ref 137–147)
TOTAL BILIRUBIN: 0.5 mg/dL (ref 0.2–1.3)
TOTAL PROTEIN: 8 g/dL (ref 6.0–8.0)

## 2022-09-26 LAB — CBC: WBC COUNT: 7.3 10*3/uL (ref 4.5–11.0)

## 2022-09-26 NOTE — Progress Notes
Preoperative Medication Plan Note:    Barbara Obrien was seen in the [facility] on 09/26/22.  As part of the visit, an accurate medication list was obtained and the patient was given pre-op medication instructions for upcoming procedure scheduled on 10/26/22 with Dr. Jimmye Norman, Dr. Wallace Cullens and Dr. Larita Fife.      Eliquis plan:  Per Lavonna Rua RN, Dr. Thayer Jew (PCP) approved Eliquis hold for 3 days prior to procedure and Lovenox bridge. The plan is as follows:    Last dose of Eliquis: 10/22/22 in the evening  Begin Lovenox 70mg  subcutaneously every 12 hours on 10/23/22 in the morning  Last dose of Lovenox: 10/25/22 in the morning only    The plan above was communicated to the patient and they verbalized understanding.    [name], [place]

## 2022-09-27 ENCOUNTER — Encounter: Admit: 2022-09-27 | Discharge: 2022-09-27 | Payer: 59 | Primary: Family

## 2022-10-03 ENCOUNTER — Ambulatory Visit: Admit: 2022-10-03 | Discharge: 2022-10-04 | Payer: 59 | Primary: Family

## 2022-10-03 ENCOUNTER — Encounter: Admit: 2022-10-03 | Discharge: 2022-10-03 | Payer: 59 | Primary: Family

## 2022-10-03 DIAGNOSIS — E119 Type 2 diabetes mellitus without complications: Secondary | ICD-10-CM

## 2022-10-03 DIAGNOSIS — I80202 Phlebitis and thrombophlebitis of unspecified deep vessels of left lower extremity: Secondary | ICD-10-CM

## 2022-10-03 DIAGNOSIS — F649 Gender identity disorder, unspecified: Secondary | ICD-10-CM

## 2022-10-03 DIAGNOSIS — N183 CKD (chronic kidney disease), stage III (HCC): Secondary | ICD-10-CM

## 2022-10-03 DIAGNOSIS — D759 Disease of blood and blood-forming organs, unspecified: Secondary | ICD-10-CM

## 2022-10-03 DIAGNOSIS — D689 Coagulation defect, unspecified: Secondary | ICD-10-CM

## 2022-10-03 DIAGNOSIS — Z8639 Personal history of other endocrine, nutritional and metabolic disease: Secondary | ICD-10-CM

## 2022-10-03 DIAGNOSIS — I2699 Other pulmonary embolism without acute cor pulmonale: Secondary | ICD-10-CM

## 2022-10-03 DIAGNOSIS — E139 Other specified diabetes mellitus without complications: Secondary | ICD-10-CM

## 2022-10-03 DIAGNOSIS — Z86718 Personal history of other venous thrombosis and embolism: Secondary | ICD-10-CM

## 2022-10-03 DIAGNOSIS — T7840XA Allergy, unspecified, initial encounter: Secondary | ICD-10-CM

## 2022-10-03 DIAGNOSIS — E785 Hyperlipidemia, unspecified: Secondary | ICD-10-CM

## 2022-10-03 DIAGNOSIS — N390 Urinary tract infection, site not specified: Secondary | ICD-10-CM

## 2022-10-03 DIAGNOSIS — K3184 Gastroparesis: Secondary | ICD-10-CM

## 2022-10-04 DIAGNOSIS — F649 Gender identity disorder, unspecified: Secondary | ICD-10-CM

## 2022-10-07 NOTE — Progress Notes
Pre-Visit Planning: Surgical         Value Time User    Start Date and Time  10/07/2022 12:14 PM 10/07/2022 12:14 PM Massie Mees, Sonia Baller, RN    Visit Type  Return Patient 10/07/2022 12:14 PM Enzo Treu, Sonia Baller, RN    Last Office Note Reviewed  Yes 10/07/2022 12:14 PM Lavanna Rog, Sonia Baller, RN    Additional Comments  Pre-op vulvoplasty 10/26/22   [place]rog 05/2021, Wallace Cullens 08/2022, Jimmye Norman 09/2022   HGB A1C 6.1 07/13/2022    cardio clearance 7/8   Does not need hem clearance prior to surgery per Dr. Wallace Cullens  10/07/2022 12:14 PM Kaylon Laroche, RN    Stop Date and Time  10/07/2022 12:14 PM 10/07/2022 12:14 PM Millianna Szymborski, Sonia Baller, RN

## 2022-10-10 ENCOUNTER — Encounter: Admit: 2022-10-10 | Discharge: 2022-10-10 | Payer: 59 | Primary: Family

## 2022-10-10 DIAGNOSIS — D6851 Activated protein C resistance: Secondary | ICD-10-CM

## 2022-10-10 DIAGNOSIS — I80202 Phlebitis and thrombophlebitis of unspecified deep vessels of left lower extremity: Secondary | ICD-10-CM

## 2022-10-10 DIAGNOSIS — Z86718 Personal history of other venous thrombosis and embolism: Secondary | ICD-10-CM

## 2022-10-10 DIAGNOSIS — E785 Hyperlipidemia, unspecified: Secondary | ICD-10-CM

## 2022-10-10 DIAGNOSIS — F649 Gender identity disorder, unspecified: Secondary | ICD-10-CM

## 2022-10-10 DIAGNOSIS — I2699 Other pulmonary embolism without acute cor pulmonale: Secondary | ICD-10-CM

## 2022-10-10 DIAGNOSIS — K219 Gastro-esophageal reflux disease without esophagitis: Secondary | ICD-10-CM

## 2022-10-10 DIAGNOSIS — K3184 Gastroparesis: Secondary | ICD-10-CM

## 2022-10-10 DIAGNOSIS — N183 CKD (chronic kidney disease), stage III (HCC): Secondary | ICD-10-CM

## 2022-10-10 DIAGNOSIS — E119 Type 2 diabetes mellitus without complications: Secondary | ICD-10-CM

## 2022-10-10 DIAGNOSIS — Z86711 Personal history of pulmonary embolism: Secondary | ICD-10-CM

## 2022-10-10 DIAGNOSIS — E139 Other specified diabetes mellitus without complications: Secondary | ICD-10-CM

## 2022-10-10 DIAGNOSIS — I10 Essential (primary) hypertension: Secondary | ICD-10-CM

## 2022-10-10 DIAGNOSIS — R32 Unspecified urinary incontinence: Secondary | ICD-10-CM

## 2022-10-10 NOTE — Progress Notes
Date: 10/10/2022    Name: Barbara Obrien  DOB: 29-Dec-1960  MRN: [id number]    Primary Care Physician: Nathaneil Canary  Gyn: Dr. Leone Brand     Chief Complaint:   Chief Complaint   Patient presents with    Heme/Onc Care     History of Present Illness   Barbara Obrien is a 62 year old transgender woman who presents in consultation at the kind request of Dr. Wallace Cullens for hematology clearance regarding factor V Leiden heterozygous mutation with planned vaginoplasty, penis amputation, orchiectomy scheduled with Dr. Wallace Cullens.  Barbara Obrien has a history of estrogen replacement 2013 through 2020 when she was diagnosed with VTE/PE.  Estrogen was discontinued, has been maintained on Eliquis since that time.  2022 underwent hypercoagulability lab testing which returned unremarkable with the exception of factor V Leiden heterozygous mutation.  Barbara Obrien has had numerous orthopedic surgeries, breast augmentation as below without any other blood clots.  Estrogen has been permanently discontinued.  She has been maintained on Eliquis most recently 2.5 mg twice daily.     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    Hyperlipidemia     Hypertension     Incontinence     Pulmonary emboli (HCC) 11/02/2018    Noted on scan from 4.24.2020       Past Surgical History  Surgical History:   Procedure Laterality Date    ACL RECONSTRUCTION Right 2015    1992, 2015 x 2, 2023    COLONOSCOPY      HX BREAST AUGMENTATION Bilateral     HX KNEE ARTHROSCOPY Right     HX ROTATOR CUFF REPAIR Right     INGUINAL HERNIA REPAIR Right     LEG DEBRIDEMENT Right     PR LAPAROSCOPY SURG RPR INITIAL INGUINAL HERNIA         Family History  Family History   Problem Relation Name Age of Onset    Cancer Mother MM 60        unknown type, stage IV    Diabetes Mother MM     Alzheimer's Mother MM     Cancer-Prostate Father      Heart Attack Brother      Heart Attack Brother      None Reported Brother      None Reported Brother         Social History  Social History     Socioeconomic History    Marital status: Widowed   Occupational History    Occupation: Web designer   Tobacco Use    Smoking status: Never    Smokeless tobacco: Never   Substance and Sexual Activity    Alcohol use: Not Currently    Drug use: Not Currently    Sexual activity: Not Currently     Partners: Female     Birth control/protection: Abstinence, None        Medications    Current Outpatient Medications:     amitriptyline (ELAVIL) 25 mg  tablet, Take one tablet by mouth at bedtime daily., Disp: , Rfl:     amLODIPine (NORVASC) 10 mg tablet, Take one tablet by mouth daily., Disp: , Rfl:     glipiZIDE (GLUCOTROL) 10 mg tablet, Take one tablet by mouth daily., Disp: , Rfl:     insulin aspart (U-100) (NOVOLOG FLEXPEN U-100 INSULIN) 100 unit/mL (3 mL) PEN, Inject  under the skin three times daily with meals. High dose correction factor/sliding scale, Disp: , Rfl:     LANTUS SOLOSTAR U-100 INSULIN 100 unit/mL (3 mL) injection PEN, Inject thirty five Units under the skin at bedtime daily., Disp: , Rfl:     lisinopriL (ZESTRIL) 10 mg tablet, Take one tablet by mouth daily., Disp: , Rfl:     metoprolol tartrate 25 mg tablet, Take one tablet by mouth twice daily., Disp: , Rfl:     nitrofurantoin monohyd/m-cryst (MACROBID) 100 mg capsule, Take one capsule by mouth daily. Take with food.  Indications: genitourinary tract infections, Disp: , Rfl:     omeprazole DR (PRILOSEC) 20 mg capsule, Take one capsule by mouth daily before breakfast., Disp: , Rfl:     OZEMPIC 0.25 mg or 0.5 mg (2 mg/3 mL) injection PEN, Inject one-half mg under the skin every 7 days., Disp: , Rfl:     rosuvastatin (CRESTOR) 20 mg tablet, TAKE ONE TABLET BY MOUTH EVERY DAY (Patient taking differently: Take one tablet by mouth at bedtime daily.), Disp: 90 tablet, Rfl: 3    spironolactone (ALDACTONE) 100 mg tablet, Take one tablet by mouth twice daily. Take with food. (Patient taking differently: Take one tablet by mouth daily. Take with food.), Disp: 60 tablet, Rfl: 3    Allergies:   Allergies   Allergen Reactions    Contrast Dye Iv, Iodine Containing [Iodinated Contrast Media] HIVES    Sulfa (Sulfonamide Antibiotics) HIVES    Tramadol SEE COMMENTS     Sweating and lightheaded        Review of Systems  Review of Systems   Constitutional: Negative.    HENT: Negative.     Eyes: Negative.    Respiratory: Negative.     Cardiovascular: Negative.    Gastrointestinal: Negative.    Endocrine: Negative.    Genitourinary: Negative.    Musculoskeletal: Negative.    Skin: Negative.    Allergic/Immunologic: Negative.    Neurological: Negative.    Hematological: Negative.    Psychiatric/Behavioral: Negative.     All other systems reviewed and are negative.    [place] Cooperative Oncology Group performance status is 0, Fully active, able to carry on all pre-disease performance without restriction.    Physical Exam  Vitals:    10/10/22 1405   BP: 108/58   BP Source: Arm, Right Upper   Pulse: 84   Temp: 36.2 ?C (97.1 ?F)   Resp: 18   SpO2: 100%   TempSrc: Temporal   PainSc: Zero   Weight: 69.9 kg (154 lb)   Height: 170 cm (5' 6.93)      Physical Exam  Constitutional:       General: She is not in acute distress.     Appearance: She is not ill-appearing.   Pulmonary:      Effort: No respiratory distress.   Skin:     Coloration: Skin is not jaundiced or pale.   Neurological:      General: No focal deficit present.      Mental Status: She is alert and oriented to person, place, and time.  Motor: No weakness.      Coordination: Coordination normal.   Psychiatric:         Mood and Affect: Mood normal.         Behavior: Behavior normal.         Thought Content: Thought content normal.         Judgment: Judgment normal.            Labs/ Imaging /Pathology   Gynecology notes, previous hypercoagulability lab testing, previous lower extremity ultrasound reports reviewed.        Assessment & Plan:  Marshelle is a 62 year old transgender female with the following medical problems:    1.  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  2.  History of provoked lower extremity DVT/PE 2020 while on estrogen.  Estrogen was permanently discontinued.  Has been on Eliquis since that time.  3.  Gender dysphoria with planned vaginoplasty, penis amputation, orchiectomy 8/21.    Today I reviewed with the patient that factor V Leiden is the most common inheritable clotting disorder for Caucasians.  Roughly 5% of Caucasians can carry an affected gene.  A heterozygous mutation is most common and associated with a 3-5 times increased risk of blood clots.  However, this is not truly felt to be significant.  It is a homozygous mutation that carries an increased risk of clotting 80 times over the general population.  Generally when patients have a first-time blood clot, especially provoked, and a heterozygous FVL mutation, 3 to 6 months of blood thinners are recommended, then stop.  It is not entirely clear to myself or the patient now why she has been continued on indefinite Eliquis.  At this point with no other history of blood clots, she can discontinue anticoagulation.    Reassuringly, she has had numerous orthopedic and other surgeries which could potentially be higher risk, but without any blood clots.  She does not clinically seem high risk for ongoing blood clots.  Likely the initial blood clot was provoked by estrogen, which has been permanently discontinued.    Current plan:  1.  Discontinue Eliquis and recently prescribed Lovenox.  2.  Okay to remain off anticoagulation unless she ever has a second blood clot.  3.  Okay from a hematologic standpoint to proceed with surgery as planned 8/21.  4.  Follow-up as needed with hematology.    Thank you for this consultation and the opportunity to participate in her care.    Parts of this note were created with voice recognition software. Please excuse any grammatical or typographical errors.

## 2022-10-10 NOTE — Progress Notes
[place] Transgender Preoperative Visit    Preferred Name:   Pronouns: she/her/hers    Chief Complaint:  Preop for Vulvoplasty    HPI:  Barbara Obrien is a 62 y.o. transgender or gender nonconforming person. Plan is for feminizing gender/genital affirming surgery.    Started hormones: 2013    Has 2 letters of support from psych, one with PhD level training:   #1:[place] 07/14/22  #2:[place] 07/15/22    Has seen (date)  Drs. Gray:08/09/22  Plastics Jimmye Norman or Ponnuru): F 09/13/22   Top surgery at same time? No  [place]: 03/22/22   Concerns from these visits?: Cardiac clear 09/12/22.   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 On daily eliquis.   - saw hematology at [facility] in [place]. Joe, diagnosed a hereditary thrombophilia   Dr. Wallace Cullens approved to move on without follow with hematology clearance     Have we confirmed all planned surgeries are approved? y  Berkley Harvey number [phone]      Any previous gender surgeries: No    Surgical support plan (who, when): daughter Gardiner Rhyme in her home  Social Concerns (ie access to running water etc): none  Psychiatric Concerns (ie depression, eating disorders, addiction, etc): none    Comorbidities:   Hiv: has not had HIV testing  HTN/Cardiac History: yes  cardiac clearance needed: clearance 7/8  DM: y  HbA1c: 6.1 5/4  waiting on lab  COPD: none  clearance needed: none  Prostate hx: none  Cancer hx: non    Nicotine test: No Hx of nicotine use. No test needed.   Negative urine and nicotine 7/11  Does/did patient have to lose weight for surgery? No  What is the patients diet? reg   Do they need a prealbumin? 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       Social History     Tobacco Use    Smoking status: Never    Smokeless tobacco: Never   Substance Use Topics    Alcohol use: Not Currently       Family History   Problem Relation Name Age of  Onset    Cancer Mother MM     Diabetes Mother MM     Cancer Father bladder cancer     Heart Attack Brother      Heart Attack Brother         Medications:  Current Outpatient Medications on File Prior to Visit   Medication Sig Dispense Refill    amitriptyline (ELAVIL) 25 mg tablet Take one tablet by mouth at bedtime daily.      amLODIPine (NORVASC) 10 mg tablet Take one tablet by mouth daily.      apixaban (ELIQUIS) 5 mg tablet Take one-half tablet by mouth twice daily.      enoxaparin (LOVENOX) 80 mg syringe Inject 0.7 mL under the skin every 12 hours. Use to bridge prior to surgery on 10/26/22      glipiZIDE (GLUCOTROL) 10 mg tablet Take one tablet by mouth daily.      insulin aspart (U-100) (NOVOLOG FLEXPEN U-100 INSULIN) 100 unit/mL (3 mL) PEN Inject  under the skin three times daily with meals. High dose correction factor/sliding scale      LANTUS SOLOSTAR U-100 INSULIN 100 unit/mL (3 mL) injection PEN Inject thirty five Units under the skin at bedtime daily.      lisinopriL (ZESTRIL) 10 mg tablet Take one tablet by mouth daily.      metoprolol tartrate 25 mg tablet Take one tablet by mouth twice daily.      nitrofurantoin monohyd/m-cryst (MACROBID) 100 mg capsule Take one capsule by mouth daily. Take with food.  Indications: genitourinary tract infections      omeprazole DR (PRILOSEC) 20 mg capsule Take one capsule by mouth daily before breakfast.      OZEMPIC 0.25 mg or 0.5 mg (2 mg/3 mL) injection PEN Inject one-half mg under the skin every 7 days.      rosuvastatin (CRESTOR) 20 mg tablet TAKE ONE TABLET BY MOUTH EVERY DAY (Patient taking differently: Take one tablet by mouth at bedtime daily.) 90 tablet 3    spironolactone (ALDACTONE) 100 mg tablet Take one tablet by mouth twice daily. Take with food. (Patient taking differently: Take one tablet by mouth daily. Take with food.) 60 tablet 3     No current facility-administered medications on file prior to visit.       Allergies:  Allergies   Allergen Reactions    Contrast Dye Iv, Iodine Containing [Iodinated Contrast Media] HIVES    Sulfa (Sulfonamide Antibiotics) HIVES    Tramadol SEE COMMENTS     Sweating and lightheaded          Review of Systems:  Constitutional: Negative for fatigue and unexpected weight change.   HENT: Negative for dental problem and hearing loss.    Eyes: Negative.  Negative for visual disturbance.   Respiratory: Negative for shortness of breath and wheezing.    Cardiovascular: Negative for chest pain.   Gastrointestinal: Negative for nausea, abdominal pain, diarrhea and constipation.   Endocrine: Negative for cold intolerance and heat intolerance.   Genitourinary: Negative for dysuria, urgency, frequency and difficulty urinating.   Musculoskeletal: Negative for joint swelling and arthralgias.   Skin: Negative for rash.   Allergic/Immunologic: Negative for immunocompromised state.   Neurological: Negative for dizziness, seizures, weakness and headaches.   Hematological: Negative.    Psychiatric/Behavioral: Negative for suicidal ideas, hallucinations, self-injury, dysphoric mood and decreased concentration. The patient is not nervous/anxious.      Physical Examination:  There were no vitals taken for this visit.  Constitutional: Oriented  to person, place, and time. Appears well-developed and well-nourished.   Head: Normocephalic.   Neck: Normal range of motion.   Cardiovascular: appears pink, well-perfused, no SOA noted  Pulmonary/Chest: Effort normal.   Musculoskeletal: Normal range of motion.    Psychiatric: Normal mood and affect.     Assessment and Plan:     Gender dysphoria, desires gender affirming genital surgery, plan for Vulvoplasty    Feminizing genital surgery via penile inversion vaginoplasty, zero-depth (aka vulvoplasty).  Penectomy, removal of penis   Orchiectomy, removal of gonads aka testicles   Scrotectomy, removal of scrotum  Urethroplasty, creation of female urethra   Possible muscle flap  Vaginoplasty, creation of zero depth vagina   Clitoroplasty, creation of clitoris   Labiaplasty, creation of labia    R/B/A of planned procedure reviewed again with patient. Risks mentioned include but not limited to: pain, infection, loss of vaginal graft, changes to libido and sexual function, inability to orgasm, wound/skin breakdown, scarring, damage to surrounding organs such as bladder or bowel, fistulas of bladder or bowel with perineum or vagina, blood loss, damage to blood vessels, need for blood transfusion, blood clot, need for intraabdominal surgery, anesthesia risks, risk of regret.  Patient understands this surgery results in permanent sterility. She has been counseled and given opportunity for fertility preservation.     She understands we will take pre and post operative pictures with our TG clinic camera as part of her HIPPA protected medical record.    Patient understand the need to take 6-8 weeks off of work for optimal surgical healing.     All questions answered. Patient provided pre and post operative instructions.     Surgeries planned: Vulvoplasty  Patient's hematologist has taken her off Elliquis as well as Lovenox, I informed her we would be re-prescribing the usually postoperative Lovenox    Visit billed by time. Total Time 35 minutes. Estimated counseling time 30 minutes. Counseled patient regarding above issues.

## 2022-10-11 ENCOUNTER — Encounter: Admit: 2022-10-11 | Discharge: 2022-10-11 | Payer: 59 | Primary: Family

## 2022-10-11 NOTE — Progress Notes
Pt was cleared by hematology Dr. Freida Busman 10/10/22. Her Pcp Dr. Thayer Jew had previous order lovenox for patient prior to surgery.  Dr. Freida Busman recommended she stop Eliquis.  Harleigh stated she also told her to stop lovenox.  Do we need to do anything else?  Agnes Buttonwillow, RN BSN  Program Coordinator  [facility]  [phone]

## 2022-10-12 ENCOUNTER — Encounter: Admit: 2022-10-12 | Discharge: 2022-10-12 | Payer: 59 | Primary: Family

## 2022-10-13 ENCOUNTER — Ambulatory Visit: Admit: 2022-10-13 | Discharge: 2022-10-14 | Payer: 59 | Primary: Family

## 2022-10-13 ENCOUNTER — Encounter: Admit: 2022-10-13 | Discharge: 2022-10-13 | Payer: 59 | Primary: Family

## 2022-10-13 DIAGNOSIS — F649 Gender identity disorder, unspecified: Secondary | ICD-10-CM

## 2022-10-13 DIAGNOSIS — I10 Essential (primary) hypertension: Secondary | ICD-10-CM

## 2022-10-13 DIAGNOSIS — E119 Type 2 diabetes mellitus without complications: Secondary | ICD-10-CM

## 2022-10-13 DIAGNOSIS — E139 Other specified diabetes mellitus without complications: Secondary | ICD-10-CM

## 2022-10-13 DIAGNOSIS — N183 CKD (chronic kidney disease), stage III (HCC): Secondary | ICD-10-CM

## 2022-10-13 DIAGNOSIS — K3184 Gastroparesis: Secondary | ICD-10-CM

## 2022-10-13 DIAGNOSIS — R32 Unspecified urinary incontinence: Secondary | ICD-10-CM

## 2022-10-13 DIAGNOSIS — D6851 Activated protein C resistance: Secondary | ICD-10-CM

## 2022-10-13 DIAGNOSIS — I2699 Other pulmonary embolism without acute cor pulmonale: Secondary | ICD-10-CM

## 2022-10-13 DIAGNOSIS — I80202 Phlebitis and thrombophlebitis of unspecified deep vessels of left lower extremity: Secondary | ICD-10-CM

## 2022-10-13 DIAGNOSIS — E785 Hyperlipidemia, unspecified: Secondary | ICD-10-CM

## 2022-10-13 DIAGNOSIS — K219 Gastro-esophageal reflux disease without esophagitis: Secondary | ICD-10-CM

## 2022-10-13 NOTE — Telephone Encounter
spoke to patient after pre op visit with Peggy.  Stated she had not further questions.  Agnes Cumming, RN BSN  Program Coordinator  [facility]  [phone]

## 2022-10-13 NOTE — Patient Instructions
Thank you for meeting with me today to discuss gender affirming surgery.    Our program coordinator is Darel Hong, BSN, RN - she is here to guide you through the preparation process.    Other members of our team include:  Brigid, BSN, RN  Sue Lush, BSN, RN  Eve, RN  Dorene Grebe, BSN, RN  Dayte, Certified Health visitor, Scientist, forensic      Please keep in touch. If at any point you are not sure where you are in the surgical preparation process or need assistance please contact us.      How to contact us about your preparation for or care related to gender affirming surgery:     Send a message via MyChart (recommended method)    Call the [facility] at [phone]   Please leave a voicemail and a member of our team will return your call.  Please only call once in a 24- hour period  For emergencies call 911    Fax Number : [phone]    Messages on the office phone line or in MyChart are reviewed during business hours (M-F, no holidays). Calls or messages received after 4 p.m. will be returned the next business day. Faxes are checked periodically throughout the week.

## 2022-10-26 ENCOUNTER — Encounter: Admit: 2022-10-26 | Discharge: 2022-10-26 | Payer: 59 | Primary: Family

## 2022-10-26 ENCOUNTER — Inpatient Hospital Stay: Admit: 2022-10-26 | Discharge: 2022-10-26 | Payer: 59 | Primary: Family

## 2022-10-26 DIAGNOSIS — E139 Other specified diabetes mellitus without complications: Secondary | ICD-10-CM

## 2022-10-26 DIAGNOSIS — D6851 Activated protein C resistance: Secondary | ICD-10-CM

## 2022-10-26 DIAGNOSIS — E785 Hyperlipidemia, unspecified: Secondary | ICD-10-CM

## 2022-10-26 DIAGNOSIS — F649 Gender identity disorder, unspecified: Secondary | ICD-10-CM

## 2022-10-26 DIAGNOSIS — I2699 Other pulmonary embolism without acute cor pulmonale: Secondary | ICD-10-CM

## 2022-10-26 DIAGNOSIS — K219 Gastro-esophageal reflux disease without esophagitis: Secondary | ICD-10-CM

## 2022-10-26 DIAGNOSIS — I80202 Phlebitis and thrombophlebitis of unspecified deep vessels of left lower extremity: Secondary | ICD-10-CM

## 2022-10-26 DIAGNOSIS — E119 Type 2 diabetes mellitus without complications: Secondary | ICD-10-CM

## 2022-10-26 DIAGNOSIS — N183 CKD (chronic kidney disease), stage III (HCC): Secondary | ICD-10-CM

## 2022-10-26 DIAGNOSIS — K3184 Gastroparesis: Secondary | ICD-10-CM

## 2022-10-26 DIAGNOSIS — R32 Unspecified urinary incontinence: Secondary | ICD-10-CM

## 2022-10-26 DIAGNOSIS — I10 Essential (primary) hypertension: Secondary | ICD-10-CM

## 2022-10-26 MED ORDER — KETAMINE 10 MG/ML IJ SOLN
INTRAVENOUS | 0 refills | Status: DC
Start: 2022-10-26 — End: 2022-10-26

## 2022-10-26 MED ORDER — DEXAMETHASONE SODIUM PHOSPHATE 4 MG/ML IJ SOLN
INTRAVENOUS | 0 refills | Status: DC
Start: 2022-10-26 — End: 2022-10-26

## 2022-10-26 MED ORDER — FENTANYL CITRATE (PF) 50 MCG/ML IJ SOLN
INTRAVENOUS | 0 refills | Status: DC
Start: 2022-10-26 — End: 2022-10-26

## 2022-10-26 MED ORDER — HYDROMORPHONE (PF) 2 MG/ML IJ SYRG
INTRAVENOUS | 0 refills | Status: DC
Start: 2022-10-26 — End: 2022-10-26

## 2022-10-26 MED ORDER — SUGAMMADEX 100 MG/ML IV SOLN
INTRAVENOUS | 0 refills | Status: DC
Start: 2022-10-26 — End: 2022-10-26

## 2022-10-26 MED ORDER — ONDANSETRON HCL (PF) 4 MG/2 ML IJ SOLN
INTRAVENOUS | 0 refills | Status: DC
Start: 2022-10-26 — End: 2022-10-26

## 2022-10-26 MED ORDER — PHENYLEPHRINE HCL IN 0.9% NACL 1 MG/10 ML (100 MCG/ML) IV SYRG
INTRAVENOUS | 0 refills | Status: DC
Start: 2022-10-26 — End: 2022-10-26

## 2022-10-26 MED ORDER — TRANEXAMIC ACID 1G/60 ML IVPB
INTRAVENOUS | 0 refills | Status: DC
Start: 2022-10-26 — End: 2022-10-26
  Administered 2022-10-26 (×2): 1 g via INTRAVENOUS

## 2022-10-26 MED ORDER — LIDOCAINE (PF) 200 MG/10 ML (2 %) IJ SYRG
INTRAVENOUS | 0 refills | Status: DC
Start: 2022-10-26 — End: 2022-10-26

## 2022-10-26 MED ORDER — PROPOFOL INJ 10 MG/ML IV VIAL
INTRAVENOUS | 0 refills | Status: DC
Start: 2022-10-26 — End: 2022-10-26

## 2022-10-26 MED ORDER — ROCURONIUM 10 MG/ML IV SOLN
INTRAVENOUS | 0 refills | Status: DC
Start: 2022-10-26 — End: 2022-10-26

## 2022-10-26 MED ORDER — CEFAZOLIN 3 GRAM IV SOLR
INTRAVENOUS | 0 refills | Status: DC
Start: 2022-10-26 — End: 2022-10-26

## 2022-10-26 MED ORDER — PHENYLEPHRINE 40 MCG/ML IN NS IV DRIP (STD CONC)
INTRAVENOUS | 0 refills | Status: DC
Start: 2022-10-26 — End: 2022-10-26
  Administered 2022-10-26 (×2): .2 ug/kg/min via INTRAVENOUS

## 2022-10-26 MED ORDER — ARTIFICIAL TEARS (PF) SINGLE DOSE DROPS GROUP
OPHTHALMIC | 0 refills | Status: DC
Start: 2022-10-26 — End: 2022-10-26

## 2022-10-26 MED ADMIN — SODIUM CHLORIDE 0.9 % IV SOLP [27838]: 1000.0000 mL | INTRAVENOUS | @ 22:00:00 | Stop: 2022-10-28 | NDC 00338004904

## 2022-10-26 MED ADMIN — HALOPERIDOL LACTATE 5 MG/ML IJ SOLN [3584]: 1 mg | INTRAVENOUS | @ 23:00:00 | Stop: 2022-10-26 | NDC 63323047400

## 2022-10-26 MED ADMIN — FENTANYL CITRATE (PF) 50 MCG/ML IJ SOLN [3037]: 50 ug | INTRAVENOUS | Stop: 2022-10-27 | NDC 00409909412

## 2022-10-26 MED ADMIN — FENTANYL CITRATE (PF) 50 MCG/ML IJ SOLN [3037]: 25 ug | INTRAVENOUS | @ 23:00:00 | Stop: 2022-10-27 | NDC 00409909412

## 2022-10-26 MED ADMIN — PHENYLEPHRINE HCL 10 MG/ML IJ SOLN [6242]: 1 mL | INTRACAVERNOUS | @ 20:00:00 | Stop: 2022-10-26 | NDC 70121157701

## 2022-10-26 MED ADMIN — DIPHENHYDRAMINE HCL 50 MG/ML IJ SOLN [2508]: 25 mg | INTRAVENOUS | @ 23:00:00 | Stop: 2022-10-26 | NDC 00641037621

## 2022-10-26 MED ADMIN — ACETAMINOPHEN 500 MG PO TAB [102]: 1000 mg | ORAL | @ 16:00:00 | Stop: 2022-10-26 | NDC 00904673061

## 2022-10-26 MED ADMIN — SODIUM CHLORIDE 0.9 % IV SOLP [27838]: 1 mL | INTRACAVERNOUS | @ 20:00:00 | Stop: 2022-10-26 | NDC 00338004904

## 2022-10-26 MED ADMIN — ENOXAPARIN 40 MG/0.4 ML SC SYRG [85052]: 40 mg | SUBCUTANEOUS | @ 18:00:00 | NDC 71288043382

## 2022-10-26 MED ADMIN — SODIUM CHLORIDE 0.9 % IV SOLP [27838]: 1000.0000 mL | INTRAVENOUS | @ 16:00:00 | Stop: 2022-10-28 | NDC 00338004904

## 2022-10-26 MED ADMIN — SODIUM CHLORIDE 0.9 % IV SOLP [27838]: 1000.0000 mL | INTRAVENOUS | @ 18:00:00 | Stop: 2022-10-28 | NDC 00338004904

## 2022-10-26 MED ADMIN — ACETAMINOPHEN 1,000 MG/100 ML (10 MG/ML) IV SOLN [305632]: 1000 mg | INTRAVENOUS | Stop: 2022-10-27 | NDC 00264410090

## 2022-10-27 MED ADMIN — IBUPROFEN 600 MG PO TAB [3844]: 600 mg | ORAL | @ 11:00:00 | NDC 00904585461

## 2022-10-27 MED ADMIN — IBUPROFEN 600 MG PO TAB [3844]: 600 mg | ORAL | @ 17:00:00 | NDC 00904585461

## 2022-10-27 MED ADMIN — HYOSCYAMINE SULFATE 0.125 MG PO TBDI [29822]: 0.125 mg | SUBLINGUAL | @ 02:00:00 | NDC 62559042201

## 2022-10-27 MED ADMIN — AMLODIPINE 10 MG PO TAB [80302]: 10 mg | ORAL | @ 13:00:00 | NDC 00904637161

## 2022-10-27 MED ADMIN — ACETAMINOPHEN 325 MG PO TAB [101]: 650 mg | ORAL | @ 09:00:00 | NDC 00904677361

## 2022-10-27 MED ADMIN — HYOSCYAMINE SULFATE 0.125 MG PO TBDI [29822]: 0.125 mg | SUBLINGUAL | @ 22:00:00 | NDC 62559042201

## 2022-10-27 MED ADMIN — INSULIN ASPART 100 UNIT/ML SC FLEXPEN [87504]: 4 [IU] | SUBCUTANEOUS | @ 17:00:00 | NDC 00169633910

## 2022-10-27 MED ADMIN — ACETAMINOPHEN 325 MG PO TAB [101]: 650 mg | ORAL | @ 15:00:00 | NDC 00904677361

## 2022-10-27 MED ADMIN — INSULIN GLARGINE 100 UNIT/ML (3 ML) SC INJ PEN [163596]: 35 [IU] | SUBCUTANEOUS | @ 04:00:00 | NDC 00088221901

## 2022-10-27 MED ADMIN — IBUPROFEN 600 MG PO TAB [3844]: 600 mg | ORAL | @ 22:00:00 | NDC 00904585461

## 2022-10-27 MED ADMIN — OXYCODONE 10 MG PO TAB [166908]: 10 mg | ORAL | @ 05:00:00 | NDC 68084096811

## 2022-10-27 MED ADMIN — POLYETHYLENE GLYCOL 3350 17 GRAM PO PWPK [25424]: 17 g | ORAL | @ 13:00:00 | NDC 00904693186

## 2022-10-27 MED ADMIN — SENNOSIDES-DOCUSATE SODIUM 8.6-50 MG PO TAB [40926]: 1 | ORAL | @ 13:00:00 | NDC 00536124810

## 2022-10-27 MED ADMIN — GABAPENTIN 100 MG PO CAP [18309]: 100 mg | ORAL | @ 02:00:00 | NDC 00904666561

## 2022-10-27 MED ADMIN — IBUPROFEN 600 MG PO TAB [3844]: 600 mg | ORAL | @ 05:00:00 | NDC 00904585461

## 2022-10-27 MED ADMIN — OXYCODONE 5 MG PO TAB [10814]: 5 mg | ORAL | Stop: 2022-10-27 | NDC 00406055223

## 2022-10-27 MED ADMIN — HYOSCYAMINE SULFATE 0.125 MG PO TBDI [29822]: 0.125 mg | SUBLINGUAL | @ 17:00:00 | NDC 62559042201

## 2022-10-27 MED ADMIN — ROSUVASTATIN 10 MG PO TAB [88503]: 20 mg | ORAL | @ 02:00:00 | NDC 00904677961

## 2022-10-27 MED ADMIN — GABAPENTIN 100 MG PO CAP [18309]: 100 mg | ORAL | @ 13:00:00 | NDC 00904666561

## 2022-10-27 MED ADMIN — HYOSCYAMINE SULFATE 0.125 MG PO TBDI [29822]: 0.125 mg | SUBLINGUAL | @ 13:00:00 | NDC 62559042201

## 2022-10-27 MED ADMIN — OXYCODONE 5 MG PO TAB [10814]: 10 mg | ORAL | @ 20:00:00 | NDC 00406055223

## 2022-10-27 MED ADMIN — LACTATED RINGERS IV SOLP [4318]: 1000.0000 mL | INTRAVENOUS | @ 04:00:00 | Stop: 2022-10-28 | NDC 00338011704

## 2022-10-27 MED ADMIN — MELATONIN 5 MG PO TAB [168576]: 10 mg | ORAL | @ 02:00:00 | NDC 20555003901

## 2022-10-27 MED ADMIN — ENOXAPARIN 40 MG/0.4 ML SC SYRG [85052]: 40 mg | SUBCUTANEOUS | @ 13:00:00 | NDC 00781324602

## 2022-10-27 MED ADMIN — GLIPIZIDE 5 MG PO TAB [10117]: 10 mg | ORAL | @ 13:00:00 | NDC 00904663761

## 2022-10-27 MED ADMIN — ACETAMINOPHEN 325 MG PO TAB [101]: 650 mg | ORAL | @ 22:00:00 | NDC 00904677361

## 2022-10-27 MED ADMIN — SENNOSIDES-DOCUSATE SODIUM 8.6-50 MG PO TAB [40926]: 1 | ORAL | @ 02:00:00 | NDC 00536124810

## 2022-10-27 MED ADMIN — HYOSCYAMINE SULFATE 0.125 MG PO TBDI [29822]: 0.125 mg | SUBLINGUAL | @ 09:00:00 | NDC 62559042201

## 2022-10-27 MED ADMIN — OXYCODONE 5 MG PO TAB [10814]: 10 mg | ORAL | @ 15:00:00 | NDC 00406055223

## 2022-10-27 MED ADMIN — GABAPENTIN 100 MG PO CAP [18309]: 100 mg | ORAL | @ 20:00:00 | NDC 00904666561

## 2022-10-27 MED ADMIN — POLYETHYLENE GLYCOL 3350 17 GRAM PO PWPK [25424]: 17 g | ORAL | @ 02:00:00 | NDC 00904693186

## 2022-10-28 ENCOUNTER — Encounter: Admit: 2022-10-28 | Discharge: 2022-10-28 | Payer: 59 | Primary: Family

## 2022-10-28 DIAGNOSIS — K219 Gastro-esophageal reflux disease without esophagitis: Secondary | ICD-10-CM

## 2022-10-28 DIAGNOSIS — K3184 Gastroparesis: Secondary | ICD-10-CM

## 2022-10-28 DIAGNOSIS — I10 Essential (primary) hypertension: Secondary | ICD-10-CM

## 2022-10-28 DIAGNOSIS — N183 CKD (chronic kidney disease), stage III (HCC): Secondary | ICD-10-CM

## 2022-10-28 DIAGNOSIS — E139 Other specified diabetes mellitus without complications: Secondary | ICD-10-CM

## 2022-10-28 DIAGNOSIS — F649 Gender identity disorder, unspecified: Secondary | ICD-10-CM

## 2022-10-28 DIAGNOSIS — R32 Unspecified urinary incontinence: Secondary | ICD-10-CM

## 2022-10-28 DIAGNOSIS — D6851 Activated protein C resistance: Secondary | ICD-10-CM

## 2022-10-28 DIAGNOSIS — E785 Hyperlipidemia, unspecified: Secondary | ICD-10-CM

## 2022-10-28 DIAGNOSIS — I2699 Other pulmonary embolism without acute cor pulmonale: Secondary | ICD-10-CM

## 2022-10-28 DIAGNOSIS — I80202 Phlebitis and thrombophlebitis of unspecified deep vessels of left lower extremity: Secondary | ICD-10-CM

## 2022-10-28 DIAGNOSIS — E119 Type 2 diabetes mellitus without complications: Secondary | ICD-10-CM

## 2022-10-28 MED ADMIN — HYOSCYAMINE SULFATE 0.125 MG PO TBDI [29822]: 0.125 mg | SUBLINGUAL | @ 02:00:00 | NDC 62559042201

## 2022-10-28 MED ADMIN — ACETAMINOPHEN 325 MG PO TAB [101]: 650 mg | ORAL | @ 20:00:00 | Stop: 2022-10-29 | NDC 00904677361

## 2022-10-28 MED ADMIN — HYOSCYAMINE SULFATE 0.125 MG PO TBDI [29822]: 0.125 mg | SUBLINGUAL | @ 14:00:00 | Stop: 2022-10-29 | NDC 62559042201

## 2022-10-28 MED ADMIN — OXYCODONE 5 MG PO TAB [10814]: 5 mg | ORAL | @ 22:00:00 | Stop: 2022-10-29 | NDC 00406055223

## 2022-10-28 MED ADMIN — IBUPROFEN 600 MG PO TAB [3844]: 600 mg | ORAL | @ 17:00:00 | Stop: 2022-10-29 | NDC 00904585461

## 2022-10-28 MED ADMIN — ACETAMINOPHEN 325 MG PO TAB [101]: 650 mg | ORAL | @ 02:00:00 | NDC 00904677361

## 2022-10-28 MED ADMIN — GABAPENTIN 100 MG PO CAP [18309]: 100 mg | ORAL | @ 02:00:00 | NDC 00904666561

## 2022-10-28 MED ADMIN — SENNOSIDES-DOCUSATE SODIUM 8.6-50 MG PO TAB [40926]: 1 | ORAL | @ 02:00:00 | NDC 00536124810

## 2022-10-28 MED ADMIN — IBUPROFEN 600 MG PO TAB [3844]: 600 mg | ORAL | @ 22:00:00 | Stop: 2022-10-29 | NDC 00904585461

## 2022-10-28 MED ADMIN — POLYETHYLENE GLYCOL 3350 17 GRAM PO PWPK [25424]: 17 g | ORAL | @ 02:00:00 | NDC 00904693186

## 2022-10-28 MED ADMIN — GABAPENTIN 100 MG PO CAP [18309]: 100 mg | ORAL | @ 20:00:00 | Stop: 2022-10-29 | NDC 67877022210

## 2022-10-28 MED ADMIN — IBUPROFEN 600 MG PO TAB [3844]: 600 mg | ORAL | @ 11:00:00 | Stop: 2022-10-29 | NDC 00904585461

## 2022-10-28 MED ADMIN — ACETAMINOPHEN 325 MG PO TAB [101]: 650 mg | ORAL | @ 16:00:00 | Stop: 2022-10-29 | NDC 00904677361

## 2022-10-28 MED ADMIN — GABAPENTIN 100 MG PO CAP [18309]: 100 mg | ORAL | @ 14:00:00 | Stop: 2022-10-29 | NDC 00904666561

## 2022-10-28 MED ADMIN — ACETAMINOPHEN 325 MG PO TAB [101]: 650 mg | ORAL | @ 09:00:00 | Stop: 2022-10-29 | NDC 00904677361

## 2022-10-28 MED ADMIN — HYOSCYAMINE SULFATE 0.125 MG PO TBDI [29822]: 0.125 mg | SUBLINGUAL | @ 20:00:00 | Stop: 2022-10-29 | NDC 62559042201

## 2022-10-28 MED ADMIN — SENNOSIDES-DOCUSATE SODIUM 8.6-50 MG PO TAB [40926]: 1 | ORAL | @ 14:00:00 | Stop: 2022-10-29 | NDC 00536124810

## 2022-10-28 MED ADMIN — ROSUVASTATIN 10 MG PO TAB [88503]: 20 mg | ORAL | @ 02:00:00 | NDC 00904677961

## 2022-10-28 MED ADMIN — HYOSCYAMINE SULFATE 0.125 MG PO TBDI [29822]: 0.125 mg | SUBLINGUAL | @ 17:00:00 | Stop: 2022-10-29 | NDC 62559042201

## 2022-10-28 MED ADMIN — GLIPIZIDE 5 MG PO TAB [10117]: 10 mg | ORAL | @ 14:00:00 | Stop: 2022-10-29 | NDC 00904663761

## 2022-10-28 MED ADMIN — MELATONIN 5 MG PO TAB [168576]: 10 mg | ORAL | @ 02:00:00 | NDC 20555003901

## 2022-10-28 MED ADMIN — ENOXAPARIN 40 MG/0.4 ML SC SYRG [85052]: 40 mg | SUBCUTANEOUS | @ 14:00:00 | Stop: 2022-10-29 | NDC 00781324602

## 2022-10-28 MED ADMIN — ONDANSETRON HCL (PF) 4 MG/2 ML IJ SOLN [136012]: 4 mg | INTRAVENOUS | @ 09:00:00 | Stop: 2022-10-29 | NDC 00641607801

## 2022-10-28 MED ADMIN — HYOSCYAMINE SULFATE 0.125 MG PO TBDI [29822]: 0.125 mg | SUBLINGUAL | @ 09:00:00 | Stop: 2022-10-29 | NDC 62559042201

## 2022-10-28 MED ADMIN — POLYETHYLENE GLYCOL 3350 17 GRAM PO PWPK [25424]: 17 g | ORAL | @ 14:00:00 | Stop: 2022-10-29 | NDC 00904693186

## 2022-10-28 MED ADMIN — AMLODIPINE 10 MG PO TAB [80302]: 10 mg | ORAL | @ 14:00:00 | Stop: 2022-10-29 | NDC 00904637161

## 2022-10-28 MED ADMIN — OXYCODONE 5 MG PO TAB [10814]: 10 mg | ORAL | @ 09:00:00 | Stop: 2022-10-29 | NDC 00406055223

## 2022-10-28 MED FILL — CIPROFLOXACIN HCL 500 MG PO TAB: 500 mg | ORAL | 3 days supply | Qty: 3 | Fill #1 | Status: CP

## 2022-10-28 MED FILL — IBUPROFEN 600 MG PO TAB: 600 mg | ORAL | 10 days supply | Qty: 30 | Fill #1 | Status: CP

## 2022-10-28 MED FILL — HYOSCYAMINE SULFATE 0.125 MG PO TBDI: 0.125 mg | SUBLINGUAL | 10 days supply | Qty: 60 | Fill #1 | Status: CP

## 2022-10-28 MED FILL — OXYCODONE 5 MG PO TAB: 5 mg | ORAL | 5 days supply | Qty: 20 | Fill #1 | Status: CP

## 2022-10-28 MED FILL — ENOXAPARIN 40 MG/0.4 ML SC SYRG: 40 mg | SUBCUTANEOUS | 7 days supply | Qty: 2.8 | Fill #1 | Status: CP

## 2022-10-30 ENCOUNTER — Encounter: Admit: 2022-10-30 | Discharge: 2022-10-30 | Payer: 59 | Primary: Family

## 2022-10-30 NOTE — Telephone Encounter
Telephone call    Pt confirmed name and DOB    Returned patient page on gynecology after-hours line.     Barbara Obrien is a 62 y.o. who called due to incision site opening. She is s/p vulvoplasty on 8/21. She reports a gaping area on her right near the cathter. Denies increase in drainage, currently serosanguinous and does not have an odor. Denies increase in pain. Denies fevers, chills, n/v.     Recommendations:  - Pt sent in a picture to her Mychart. See encounters tab for image. There is a posterior area of superficial dehiscence, however, underlying tissue appears healthy and well-healing. No abnormal discharge or discoloration. No surrounding erythema. Normal postoperative ecchymosis.   - Reassurance provided that this will continue to heal and no intervention is necessary. She is scheduled for postop visit in 2 days.   - All questions answered. Routed to Dr. Wallace Cullens.    D/w Dr. Arta Silence, MD   PGY2 Obstetrics and Gynecology

## 2022-10-31 ENCOUNTER — Encounter: Admit: 2022-10-31 | Discharge: 2022-10-31 | Payer: 59 | Primary: Family

## 2022-10-31 NOTE — Telephone Encounter
No call made per protocol, PCP listed is not with [facility]

## 2022-11-01 ENCOUNTER — Ambulatory Visit: Admit: 2022-11-01 | Discharge: 2022-11-02 | Payer: 59 | Primary: Family

## 2022-11-01 ENCOUNTER — Encounter: Admit: 2022-11-01 | Discharge: 2022-11-01 | Payer: 59 | Primary: Family

## 2022-11-01 DIAGNOSIS — I80202 Phlebitis and thrombophlebitis of unspecified deep vessels of left lower extremity: Secondary | ICD-10-CM

## 2022-11-01 DIAGNOSIS — K219 Gastro-esophageal reflux disease without esophagitis: Secondary | ICD-10-CM

## 2022-11-01 DIAGNOSIS — R32 Unspecified urinary incontinence: Secondary | ICD-10-CM

## 2022-11-01 DIAGNOSIS — I2699 Other pulmonary embolism without acute cor pulmonale: Secondary | ICD-10-CM

## 2022-11-01 DIAGNOSIS — E119 Type 2 diabetes mellitus without complications: Secondary | ICD-10-CM

## 2022-11-01 DIAGNOSIS — E139 Other specified diabetes mellitus without complications: Secondary | ICD-10-CM

## 2022-11-01 DIAGNOSIS — D6851 Activated protein C resistance: Secondary | ICD-10-CM

## 2022-11-01 DIAGNOSIS — K3184 Gastroparesis: Secondary | ICD-10-CM

## 2022-11-01 DIAGNOSIS — I10 Essential (primary) hypertension: Secondary | ICD-10-CM

## 2022-11-01 DIAGNOSIS — T8131XA Disruption of external operation (surgical) wound, not elsewhere classified, initial encounter: Secondary | ICD-10-CM

## 2022-11-01 DIAGNOSIS — F640 Transsexualism: Secondary | ICD-10-CM

## 2022-11-01 DIAGNOSIS — N183 CKD (chronic kidney disease), stage III (HCC): Secondary | ICD-10-CM

## 2022-11-01 DIAGNOSIS — F649 Gender identity disorder, unspecified: Secondary | ICD-10-CM

## 2022-11-01 DIAGNOSIS — E785 Hyperlipidemia, unspecified: Secondary | ICD-10-CM

## 2022-11-01 NOTE — Progress Notes
GRS Post op     Subjective:     S/p GRS on 10/26/22  Sent in photo with dehiscence, in chart.  Returning to normal activities.   Catheter removed without difficulty.     Objective:     BP 125/78  - Ht 167.6 cm (5' 6)  - Wt 70.8 kg (156 lb)  - BMI 25.18 kg/m?      Pelvic exam:     Dehiscence from right posterior fourchette along incision line  Thin membrane-covered tissue, not subcutaneous fatty texture, protruding from dehiscence.   Palpated and tried to reduce and tunnels ~ 4cm in, but no further.     Urethra, clitoris healing well. Some exudate along urethra-skin incision lines.   Neo-clitoris neurovascular bundle sensate.    Assessment & Plan:     1 weeks s/p GRS. Slight dehiscence.    Packed 1/2 sheet moistened Puracol into cavity, covered with folded other 1/2 sheet as bolster, secured with steristrips    Aesthetically accurate neo-female genitalia   RTC @ 2 wks, then 2 and 6 months post op

## 2022-11-01 NOTE — Progress Notes
Pt presented to clinic for 1 w post op.  sterile water instilled into bladder via foley.  Foley removed.  Pt able to void out.  Provider examined pt.  Pt healing appropriately.  Provider packed open stitch line with collagen and secured with steri strips & mastisol.  Pt questions answered.  Pt had no further needs.  Pt left clinic without assistance.

## 2022-11-01 NOTE — Patient Instructions
We are the [facility].    Our providers:   Dr. Quinn Jackson  Dr. Miles Crowley  Dr. Faith Butler  Dr. Hannah Maxfield  Dr. Michelle Sommer  Dr. Meredith Gray  Dr. Courtney Marsh  Margaret (Peggy) Tuttle, PA  Erin Martin, CNM    Our nurses and medical assistants:   Judy, BSN, RN - Program Coordinator  Eve, RN  Oumar Marcott, BSN, RN  Natalie, BSN, RN  Brigid, BSN, RN  Dayte, Certified Medical Assistant  Ben, Certified Medical Assistant    To Schedule appointments in the [facility] call [phone].     Contacting us about your care related to medical transition:     Send a message via MyChart (recommended method)    Call the [facility] at [phone]   Please leave a voicemail and a member of our team will return your call.  Please only call once in a 24- hour period  For emergencies call 911    Fax Number : [phone]    Messages on the office phone line or in MyChart are reviewed during business hours (M-F, no holidays). Calls or messages received after 4 p.m. will be returned the next business day. Faxes are checked periodically throughout the week.    If you see Dr. Jackson, Dr. Crowley, Dr. Butler, Dr. Maxfield, or Dr. Sommer for primary care (care outside of medical transition) and have questions related to this medical care:   Send a MyChart message  Call [facility] : [phone]  For emergencies, call 911    Thank you for allowing us to be a part of your care team!

## 2022-11-02 ENCOUNTER — Encounter: Admit: 2022-11-02 | Discharge: 2022-11-02 | Payer: 59 | Primary: Family

## 2022-11-02 ENCOUNTER — Emergency Department: Admit: 2022-11-02 | Discharge: 2022-11-02 | Payer: 59

## 2022-11-02 DIAGNOSIS — D6851 Activated protein C resistance: Secondary | ICD-10-CM

## 2022-11-02 DIAGNOSIS — I2699 Other pulmonary embolism without acute cor pulmonale: Secondary | ICD-10-CM

## 2022-11-02 DIAGNOSIS — K3184 Gastroparesis: Secondary | ICD-10-CM

## 2022-11-02 DIAGNOSIS — I80202 Phlebitis and thrombophlebitis of unspecified deep vessels of left lower extremity: Secondary | ICD-10-CM

## 2022-11-02 DIAGNOSIS — F649 Gender identity disorder, unspecified: Secondary | ICD-10-CM

## 2022-11-02 DIAGNOSIS — K219 Gastro-esophageal reflux disease without esophagitis: Secondary | ICD-10-CM

## 2022-11-02 DIAGNOSIS — N183 CKD (chronic kidney disease), stage III (HCC): Secondary | ICD-10-CM

## 2022-11-02 DIAGNOSIS — R32 Unspecified urinary incontinence: Secondary | ICD-10-CM

## 2022-11-02 DIAGNOSIS — E119 Type 2 diabetes mellitus without complications: Secondary | ICD-10-CM

## 2022-11-02 DIAGNOSIS — E785 Hyperlipidemia, unspecified: Secondary | ICD-10-CM

## 2022-11-02 DIAGNOSIS — I10 Essential (primary) hypertension: Secondary | ICD-10-CM

## 2022-11-02 DIAGNOSIS — E139 Other specified diabetes mellitus without complications: Secondary | ICD-10-CM

## 2022-11-02 MED ORDER — MORPHINE 4 MG/ML IV SYRG
4 mg | Freq: Once | INTRAVENOUS | 0 refills | Status: CP
Start: 2022-11-02 — End: ?
  Administered 2022-11-03: 05:00:00 4 mg via INTRAVENOUS

## 2022-11-02 MED ORDER — ONDANSETRON HCL (PF) 4 MG/2 ML IJ SOLN
4 mg | Freq: Once | INTRAVENOUS | 0 refills | Status: CP
Start: 2022-11-02 — End: ?
  Administered 2022-11-03: 05:00:00 4 mg via INTRAVENOUS

## 2022-11-02 MED ORDER — DIPHENHYDRAMINE HCL 50 MG/ML IJ SOLN
25 mg | Freq: Once | INTRAVENOUS | 0 refills | Status: CP
Start: 2022-11-02 — End: ?
  Administered 2022-11-03: 05:00:00 25 mg via INTRAVENOUS

## 2022-11-02 MED ORDER — LACTATED RINGERS IV SOLP
1000 mL | INTRAVENOUS | 0 refills | Status: CP
Start: 2022-11-02 — End: ?
  Administered 2022-11-03: 05:00:00 1000 mL via INTRAVENOUS

## 2022-11-03 ENCOUNTER — Emergency Department: Admit: 2022-11-03 | Discharge: 2022-11-02 | Payer: 59

## 2022-11-03 ENCOUNTER — Encounter: Admit: 2022-11-03 | Discharge: 2022-11-03 | Payer: 59 | Primary: Family

## 2022-11-03 LAB — POC CREATININE, RAD: CREATININE, POC: 1.6 mg/dL — ABNORMAL HIGH (ref 0.4–1.00)

## 2022-11-03 LAB — POC LACTATE: LACTIC ACID POC: 1.9 MMOL/L (ref 0.5–2.0)

## 2022-11-03 MED ADMIN — CEFTRIAXONE INJ 2GM IVP [210254]: 2 g | INTRAVENOUS | @ 07:00:00 | Stop: 2022-11-03 | NDC 60505614900

## 2022-11-03 MED ADMIN — ONDANSETRON 4 MG PO TBDI [82394]: 4 mg | ORAL | @ 19:00:00 | Stop: 2022-11-03 | NDC 68462015740

## 2022-11-03 MED ADMIN — POLYETHYLENE GLYCOL 3350 17 GRAM PO PWPK [25424]: 17 g | ORAL | @ 17:00:00 | Stop: 2022-11-03 | NDC 00904693186

## 2022-11-03 MED ADMIN — SODIUM CHLORIDE 0.9 % IV PGBK (MB+) [95161]: 4.5 g | INTRAVENOUS | @ 11:00:00 | Stop: 2022-11-03 | NDC 00338915930

## 2022-11-03 MED ADMIN — SODIUM CHLORIDE 0.9 % IJ SOLN [7319]: 50 mL | INTRAVENOUS | @ 06:00:00 | Stop: 2022-11-03 | NDC 00409488820

## 2022-11-03 MED ADMIN — OXYCODONE 5 MG PO TAB [10814]: 5 mg | ORAL | @ 08:00:00 | Stop: 2022-11-03 | NDC 00406055223

## 2022-11-03 MED ADMIN — IOHEXOL 350 MG IODINE/ML IV SOLN [81210]: 80 mL | INTRAVENOUS | @ 06:00:00 | Stop: 2022-11-03 | NDC 00407141491

## 2022-11-03 MED ADMIN — ACETAMINOPHEN 325 MG PO TAB [101]: 650 mg | ORAL | @ 17:00:00 | Stop: 2022-11-03 | NDC 00904677361

## 2022-11-03 MED ADMIN — METRONIDAZOLE 500 MG PO TAB [5016]: 500 mg | ORAL | @ 13:00:00 | Stop: 2022-11-03 | NDC 72578000801

## 2022-11-03 MED ADMIN — ACETAMINOPHEN 325 MG PO TAB [101]: 650 mg | ORAL | @ 13:00:00 | Stop: 2022-11-03 | NDC 00904677361

## 2022-11-03 MED ADMIN — METOPROLOL TARTRATE 25 MG PO TAB [37637]: 25 mg | ORAL | @ 13:00:00 | Stop: 2022-11-03 | NDC 51079025501

## 2022-11-03 MED ADMIN — INSULIN ASPART 100 UNIT/ML SC FLEXPEN [87504]: 2 [IU] | SUBCUTANEOUS | @ 18:00:00 | Stop: 2022-11-03 | NDC 00169633910

## 2022-11-03 MED ADMIN — ACETAMINOPHEN 325 MG PO TAB [101]: 650 mg | ORAL | @ 08:00:00 | Stop: 2022-11-03 | NDC 00904677361

## 2022-11-03 MED ADMIN — PIPERACILLIN-TAZOBACTAM 4.5 GRAM IV SOLR [80419]: 4.5 g | INTRAVENOUS | @ 18:00:00 | Stop: 2022-11-03 | NDC 60505615900

## 2022-11-03 MED ADMIN — OXYCODONE 5 MG PO TAB [10814]: 10 mg | ORAL | @ 13:00:00 | Stop: 2022-11-03 | NDC 00406055223

## 2022-11-03 MED ADMIN — SENNOSIDES-DOCUSATE SODIUM 8.6-50 MG PO TAB [40926]: 1 | ORAL | @ 17:00:00 | Stop: 2022-11-03 | NDC 00536124810

## 2022-11-03 MED ADMIN — PANTOPRAZOLE 20 MG PO TBEC [79463]: 20 mg | ORAL | @ 13:00:00 | Stop: 2022-11-03 | NDC 50268058511

## 2022-11-03 MED ADMIN — AMLODIPINE 10 MG PO TAB [80302]: 10 mg | ORAL | @ 13:00:00 | Stop: 2022-11-03 | NDC 00904637161

## 2022-11-03 MED ADMIN — SODIUM CHLORIDE 0.9 % IV PGBK (MB+) [95161]: 4.5 g | INTRAVENOUS | @ 18:00:00 | Stop: 2022-11-03 | NDC 00338915930

## 2022-11-03 MED ADMIN — PIPERACILLIN-TAZOBACTAM 4.5 GRAM IV SOLR [80419]: 4.5 g | INTRAVENOUS | @ 11:00:00 | Stop: 2022-11-03 | NDC 60505615900

## 2022-11-03 MED FILL — AMOXICILLIN-POT CLAVULANATE 875-125 MG PO TAB: 875/125 mg | ORAL | 5 days supply | Qty: 10 | Fill #1 | Status: CP

## 2022-11-03 MED FILL — DOXYCYCLINE HYCLATE 100 MG PO TAB: 100 mg | ORAL | 5 days supply | Qty: 10 | Fill #1 | Status: CP

## 2022-11-03 NOTE — Telephone Encounter
Telephone call    Pt confirmed name and DOB    Returned patient page on gynecology after-hours line.     Barbara Obrien is a 62 y.o. [id number] who called due to fever of 101.8. Underwent vulvoplasty 8/21. Reports some nausea and chills today. She has a dehiscence of her wound that was packed in clinic yesterday but she reports this fell out shortly after it was placed. Pain is manageable.     Recommendations:  Discussed that comprehensive evaluation cannot be completed over the phone and that patient should come to the hospital for further workup given her new fever and chills postoperatively. All questions answered. Routed to Dr. Wallace Cullens.    D/w Dr. Leone Payor, MD   PGY2 Obstetrics and Gynecology

## 2022-11-08 ENCOUNTER — Encounter: Admit: 2022-11-08 | Discharge: 2022-11-08 | Payer: 59 | Primary: Family

## 2022-11-08 ENCOUNTER — Ambulatory Visit: Admit: 2022-11-08 | Discharge: 2022-11-09 | Payer: 59 | Primary: Family

## 2022-11-08 DIAGNOSIS — F640 Transsexualism: Secondary | ICD-10-CM

## 2022-11-08 DIAGNOSIS — F649 Gender identity disorder, unspecified: Secondary | ICD-10-CM

## 2022-11-08 DIAGNOSIS — I10 Essential (primary) hypertension: Secondary | ICD-10-CM

## 2022-11-08 DIAGNOSIS — D6851 Activated protein C resistance: Secondary | ICD-10-CM

## 2022-11-08 DIAGNOSIS — E139 Other specified diabetes mellitus without complications: Secondary | ICD-10-CM

## 2022-11-08 DIAGNOSIS — E785 Hyperlipidemia, unspecified: Secondary | ICD-10-CM

## 2022-11-08 DIAGNOSIS — I2699 Other pulmonary embolism without acute cor pulmonale: Secondary | ICD-10-CM

## 2022-11-08 DIAGNOSIS — R32 Unspecified urinary incontinence: Secondary | ICD-10-CM

## 2022-11-08 DIAGNOSIS — N183 CKD (chronic kidney disease), stage III (HCC): Secondary | ICD-10-CM

## 2022-11-08 DIAGNOSIS — K219 Gastro-esophageal reflux disease without esophagitis: Secondary | ICD-10-CM

## 2022-11-08 DIAGNOSIS — E119 Type 2 diabetes mellitus without complications: Secondary | ICD-10-CM

## 2022-11-08 DIAGNOSIS — I80202 Phlebitis and thrombophlebitis of unspecified deep vessels of left lower extremity: Secondary | ICD-10-CM

## 2022-11-08 DIAGNOSIS — K3184 Gastroparesis: Secondary | ICD-10-CM

## 2022-11-08 NOTE — Progress Notes
GRS Post op     Subjective:      S/p GRS on 10/26/22  After catheter removal, ended up in ED with UTI, feeling better now  Normal bladder and bowel function.   Open area remains open with no edge advancement but less depth so it is healing 2nd intention     Objective:     BP 115/75  - Ht 167.6 cm (5' 6)  - Wt 69.9 kg (154 lb)  - BMI 24.86 kg/m?      Pelvic exam:   Labial skin incisions healing well.   Minor, expected skin disruption around posterior fourchette granulating in and healing appropriately.   Urethra, clitoris healing well. Some exudate along urethra-skin incision lines.   Neo-clitoris neurovascular bundle sensate.  Vaginal canal intact w/ adequate length and depth for penetrative intercourse.       Assessment & Plan:     2 weeks s/p GRS. Healing well.   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

## 2022-11-08 NOTE — Telephone Encounter
No call made per protocol, PCP listed is not with [facility]

## 2022-11-14 ENCOUNTER — Encounter: Admit: 2022-11-14 | Discharge: 2022-11-14 | Payer: 59 | Primary: Family

## 2022-11-16 ENCOUNTER — Encounter: Admit: 2022-11-16 | Discharge: 2022-11-16 | Payer: 59 | Primary: Family

## 2022-12-13 ENCOUNTER — Encounter: Admit: 2022-12-13 | Discharge: 2022-12-13 | Primary: Family

## 2022-12-13 ENCOUNTER — Ambulatory Visit: Admit: 2022-12-13 | Discharge: 2022-12-14 | Primary: Family

## 2022-12-20 ENCOUNTER — Ambulatory Visit: Admit: 2022-12-20 | Discharge: 2022-12-21 | Primary: Family

## 2022-12-20 ENCOUNTER — Encounter: Admit: 2022-12-20 | Discharge: 2022-12-20 | Primary: Family

## 2022-12-20 DIAGNOSIS — N1831 Stage 3a chronic kidney disease (HCC): Secondary | ICD-10-CM

## 2022-12-20 DIAGNOSIS — E119 Type 2 diabetes mellitus without complications: Secondary | ICD-10-CM

## 2022-12-20 DIAGNOSIS — F649 Gender identity disorder, unspecified: Secondary | ICD-10-CM

## 2022-12-20 DIAGNOSIS — I10 Essential (primary) hypertension: Secondary | ICD-10-CM

## 2022-12-20 DIAGNOSIS — E139 Other specified diabetes mellitus without complications: Secondary | ICD-10-CM

## 2022-12-20 DIAGNOSIS — N183 CKD (chronic kidney disease), stage III (HCC): Secondary | ICD-10-CM

## 2022-12-20 DIAGNOSIS — E785 Hyperlipidemia, unspecified: Secondary | ICD-10-CM

## 2022-12-20 DIAGNOSIS — G8918 Other acute postprocedural pain: Secondary | ICD-10-CM

## 2022-12-20 DIAGNOSIS — F640 Transsexualism: Secondary | ICD-10-CM

## 2022-12-20 DIAGNOSIS — K3184 Gastroparesis: Secondary | ICD-10-CM

## 2022-12-20 DIAGNOSIS — I2699 Other pulmonary embolism without acute cor pulmonale: Secondary | ICD-10-CM

## 2022-12-20 DIAGNOSIS — D6851 Activated protein C resistance: Secondary | ICD-10-CM

## 2022-12-20 DIAGNOSIS — K219 Gastro-esophageal reflux disease without esophagitis: Secondary | ICD-10-CM

## 2022-12-20 DIAGNOSIS — I80202 Phlebitis and thrombophlebitis of unspecified deep vessels of left lower extremity: Secondary | ICD-10-CM

## 2022-12-20 DIAGNOSIS — E7849 Other hyperlipidemia: Secondary | ICD-10-CM

## 2022-12-20 DIAGNOSIS — R32 Unspecified urinary incontinence: Secondary | ICD-10-CM

## 2022-12-20 LAB — PROTEIN/CR RATIO,UR RAN
PROT CREAT RAT/CAL: 0.1 (ref <0.15)
UR CREATININE, RAN: 46 mg/dL
UR TOTAL PROTEIN,RAN: 4 mg/dL

## 2022-12-20 MED ORDER — EMPAGLIFLOZIN 10 MG PO TAB
10 mg | ORAL_TABLET | Freq: Every day | ORAL | 3 refills | Status: AC
Start: 2022-12-20 — End: ?

## 2022-12-20 NOTE — Patient Instructions
Please do not hesitate to call with any questions.  My nurse Dahlia Client can be reached [phone].     Start taking jardiance 10mg  daily      Return to clinic in 6 months    SGLT-2 inhibitors (medications ending in gliflozin)   These medications have shown to:    Reduce kidney disease progression  Cardiovascular benefit  Potentially improving survival  Main risks  Increase in urine tract infections and yeast infections  Possible increase risk of amputations  Increase risk of Diabetic Ketoacidosis (DKA)       What is Diabetic Ketoacidosis (DKA)?   DKA happens when the body does not have enough insulin and acid levels get too high in the blood   It is a rare, but serious and needs urgent medical treatment   Most likely happens around time of surgery, serious infection or illness     What are the Symptoms of DKA:   Nausea, vomiting, abdominal pain, rapid breathing, and dehydration (dizziness and increase thirst)   If you experience these symptoms please call or if severe go to the nearest emergency room     Please stop taking your SGLT2 inhibitor:   At least 3 days before fasting for surgery or procedure   Unable to eat or drink -Nausea and vomiting   If you are unwell with infection or illness

## 2022-12-21 ENCOUNTER — Encounter: Admit: 2022-12-21 | Discharge: 2022-12-21 | Primary: Family

## 2023-01-11 ENCOUNTER — Encounter: Admit: 2023-01-11 | Discharge: 2023-01-11 | Primary: Family

## 2023-04-14 ENCOUNTER — Encounter: Admit: 2023-04-14 | Discharge: 2023-04-14 | Primary: Family

## 2023-06-21 IMAGING — MR Knee^Routine
5 series · 39 of 40 positions shown · non-contrast
Comparison: none

[Series 3: T2 fat-sat · axial · 4.0mm · 0.50mm/px · z∈[-76,+29]mm · 7 of 25 slices shown (1 of 3)]
[im 1/25]
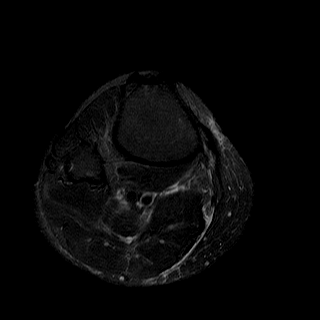
[im 5/25]
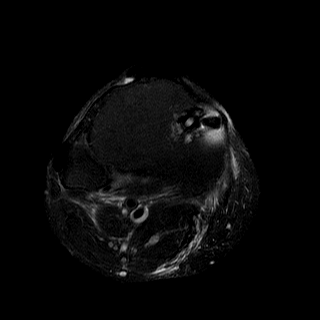
[im 9/25]
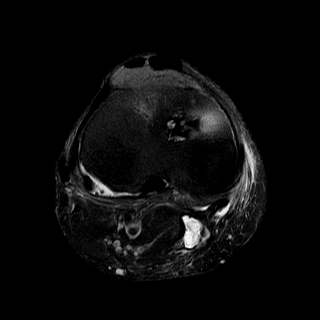
[im 13/25]
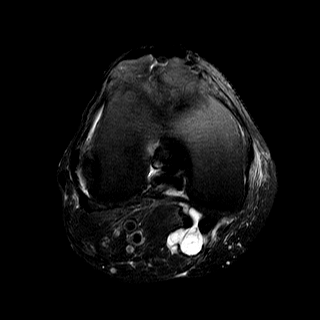
[im 17/25]
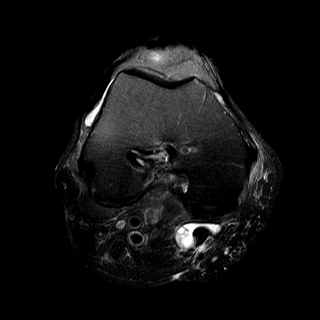
[im 21/25]
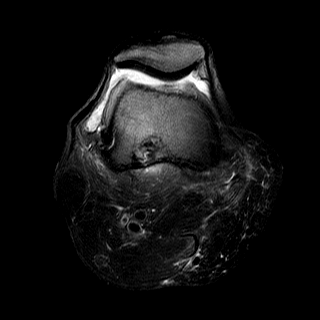
[im 25/25]
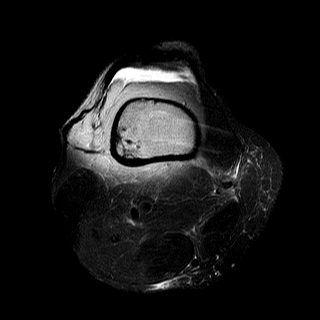

[Series 4: T1 · axial · 4.0mm · 0.62mm/px · z∈[-76,+12]mm · 7 of 25 slices shown]
[im 1/25]
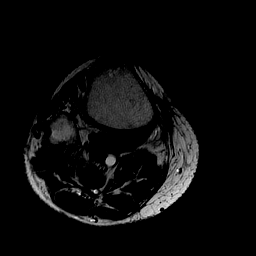
[im 4/25]
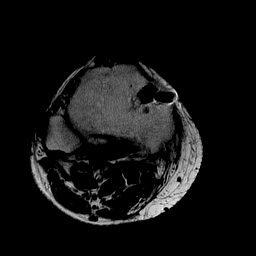
[im 7/25]
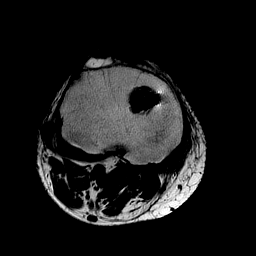
[im 11/25]
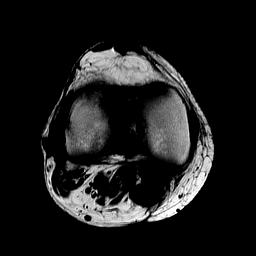
[im 14/25]
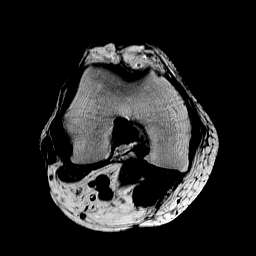
[im 18/25]
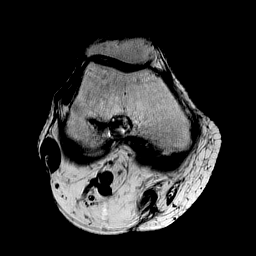
[im 21/25]
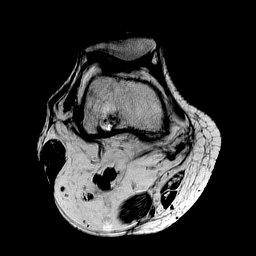

[Series 5: T2 fat-sat · coronal · 3.5mm · 0.50mm/px · 9 of 28 slices shown (2 of 3)]
[im 1/28]
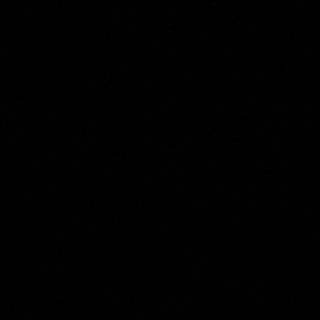
[im 4/28]
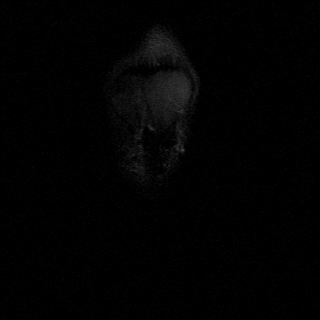
[im 7/28]
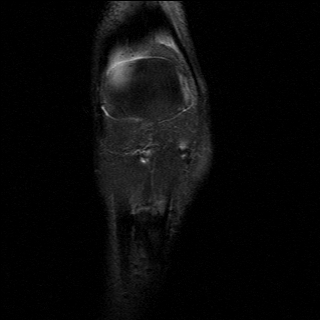
[im 11/28]
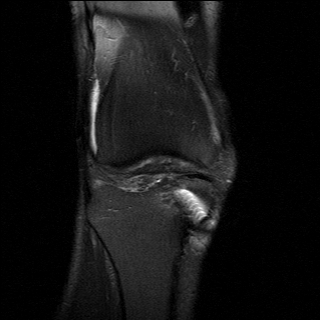
[im 14/28]
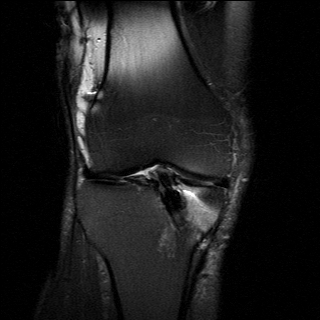
[im 17/28]
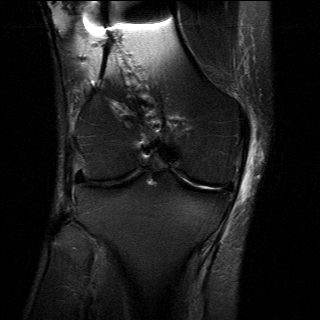
[im 21/28]
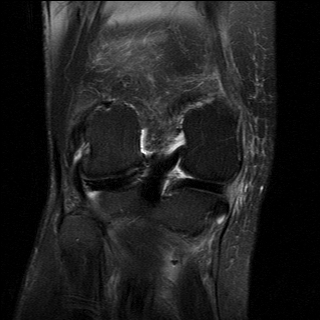
[im 24/28]
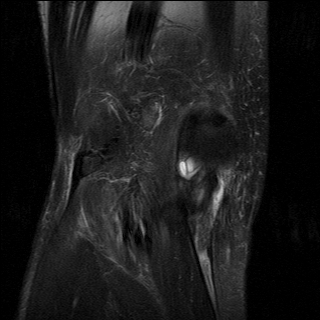
[im 28/28]
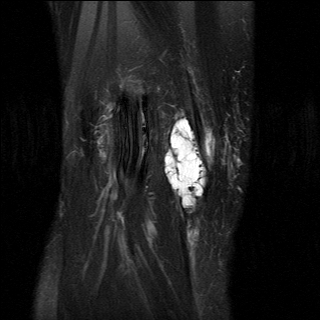

[Series 6: T2 fat-sat · sagittal · 4.0mm · 0.50mm/px · 8 of 25 slices shown (3 of 3)]
[im 1/25]
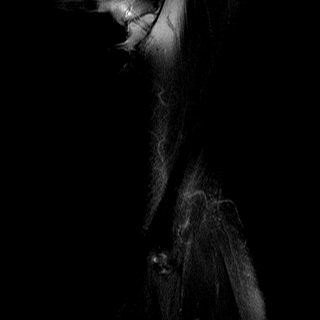
[im 4/25]
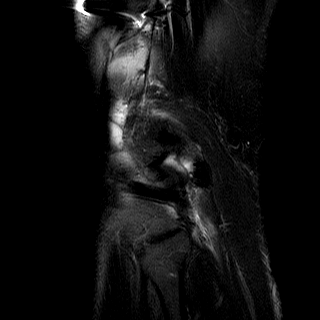
[im 7/25]
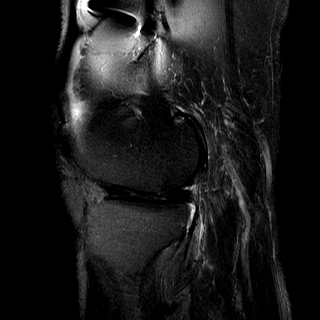
[im 11/25]
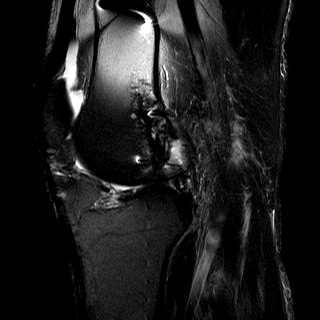
[im 14/25]
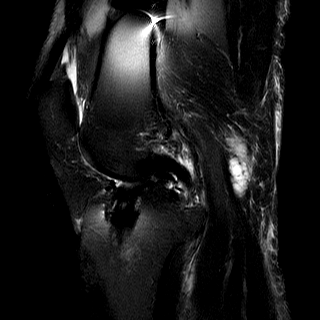
[im 18/25]
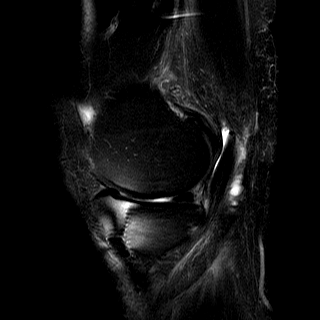
[im 21/25]
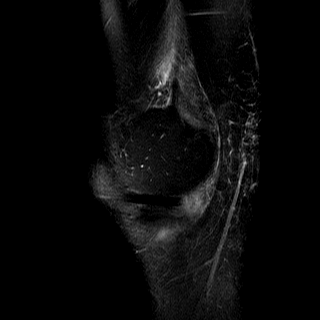
[im 25/25]
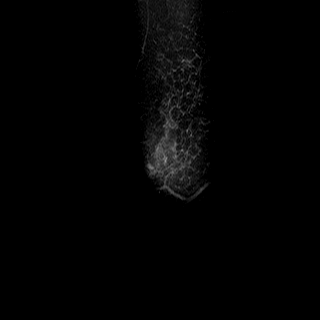

[Series 7: PD · sagittal · 4.0mm · 0.29mm/px · 8 of 25 slices shown]
[im 1/25]
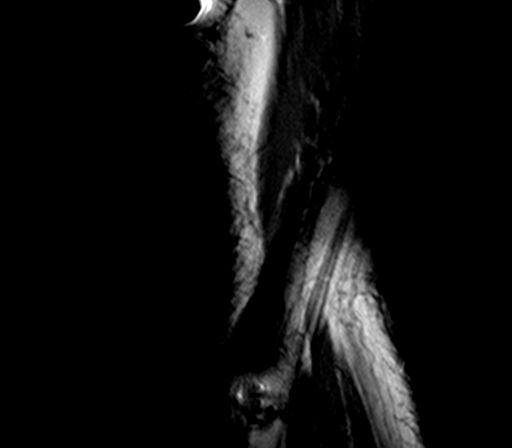
[im 4/25]
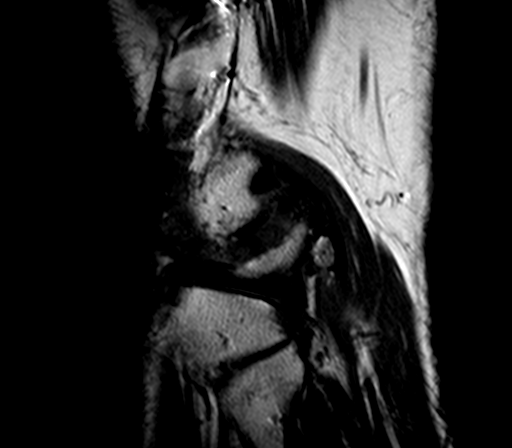
[im 7/25]
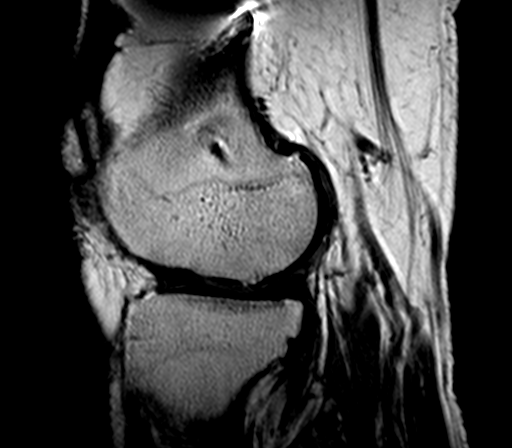
[im 11/25]
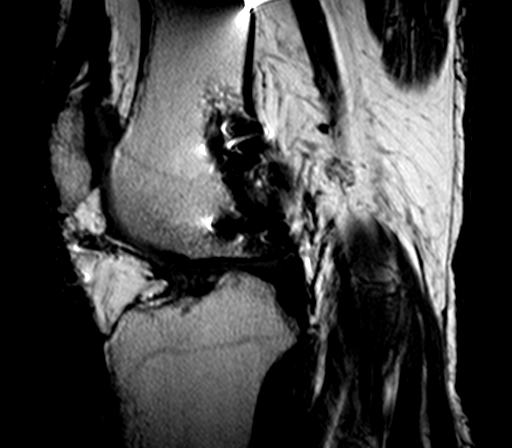
[im 14/25]
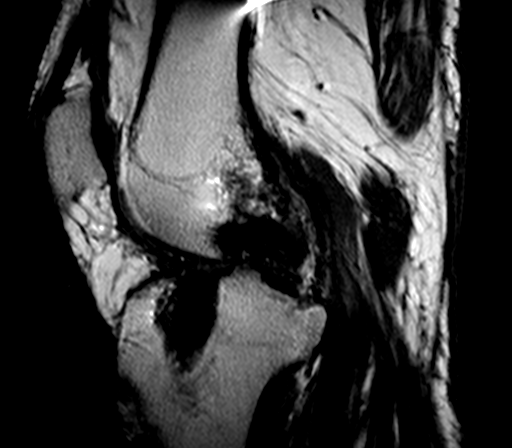
[im 18/25]
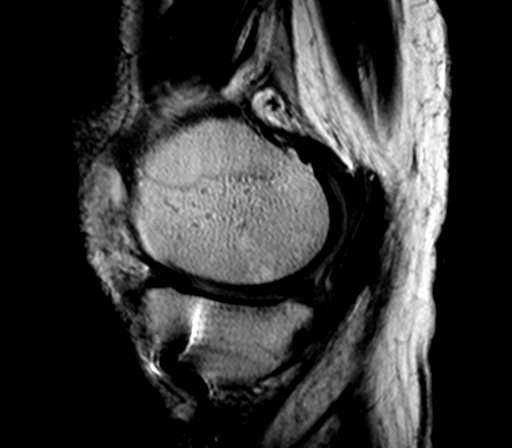
[im 21/25]
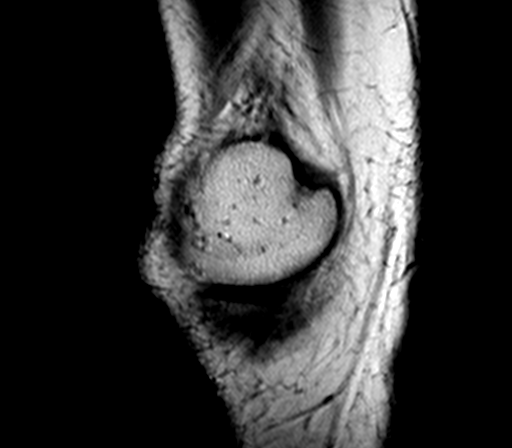
[im 25/25]
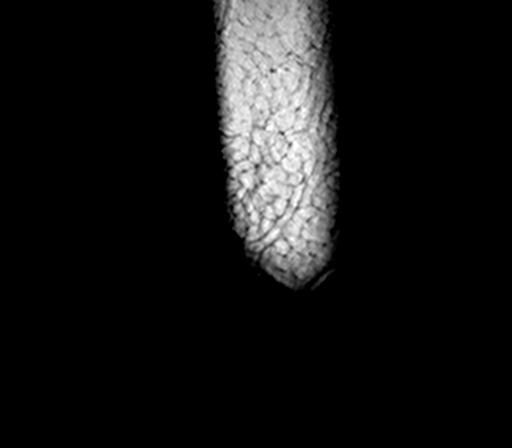

[39 of 40 positions shown; findings below may reference images not displayed]

DIAGNOSTIC STUDIES

EXAM

MRI of the right knee without contrast

INDICATION

knee pain
RT MEDIAL KNEE PAIN, WORSE WITH STAIRS.  HX ACL REPAIR X 2.  RG

TECHNIQUE

Sagittal axial and coronal images were were obtained with variable T1 and T2 weighting.

COMPARISONS

None available

FINDINGS

Quadriceps and patellar tendons are within normal limits. The articular cartilage of patellofemoral
joint space is well preserved. Small knee effusion is noted. Small suprapatellar plica is evident.
There is mild edema in Hoffa's fat pad which may indicate synovitis.

There is a septated popliteal cyst measuring 5.3 cm maximally.

Prior ACL repair is seen. ACL graft is intact. The posterior cruciate ligament is unremarkable.

The medial and lateral collateral ligaments are within normal limits.

The medial meniscus is normal.

There is blunting of the free edge of the posterior horn of the lateral meniscus and minimal signal
in the superior articular surface suggesting small tear in this location. This could represent old
injury as well.

IMPRESSION

Irregular appearance of the posterior horn of the lateral meniscus with blunting of the free edge
and signal contacting the superior articular surface consistent with tear. This should be correlated
with symptoms. Old injury could account for this appearance as well.

Prior ACL repair.

Popliteal cyst.

Knee effusion with suprapatellar plica. There is mild edema in Hoffa's fat pad which may indicate
synovitis.

Tech Notes:

RT MEDIAL KNEE PAIN, WORSE WITH STAIRS.  HX ACL REPAIR X 2.  RG

## 2023-09-04 ENCOUNTER — Encounter: Admit: 2023-09-04 | Discharge: 2023-09-04 | Primary: Family

## 2023-09-05 ENCOUNTER — Encounter: Admit: 2023-09-05 | Discharge: 2023-09-05 | Primary: Family

## 2023-09-05 ENCOUNTER — Ambulatory Visit: Admit: 2023-09-05 | Discharge: 2023-09-06 | Primary: Family

## 2023-09-19 ENCOUNTER — Encounter: Admit: 2023-09-19 | Discharge: 2023-09-19 | Primary: Family

## 2023-09-19 DIAGNOSIS — Z136 Encounter for screening for cardiovascular disorders: Principal | ICD-10-CM

## 2023-09-19 NOTE — Patient Instructions
Follow up as need

## 2023-09-19 NOTE — Progress Notes
 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  I saw her last year for preoperative cardiac restratification prior to planned vulvoplasty.      She is doing well.  Denies any chest pain, shortness of breath, lightheadedness, palpitations or syncope.  She has recovered well from her surgery.  She had some mild postoperative infection but is overall doing well.  She was previously on hormone supplementation but this was stopped in light of her VTE.  She is currently taking spironolactone .  Reports blood pressure has been well-controlled over the past year.  She has had no significant bleeding issues with the apixaban.              ECG: Normal sinus rhythm.     Transthoracic echocardiogram: 07/29/2021  Left Ventricle: The left ventricular size is normal. Concentric remodeling. Mild basal septal thickening. The left ventricular systolic function is normal. The ejection fraction by Simpson's biplane method is 56%. There are no segmental wall motion abnormalities. Normal left ventricular diastolic function. Normal left atrial pressure.  Right Ventricle: Ventricle not well seen. The right ventricular systolic function is normal.  No hemodynamically significant abnormalities.  Normal central venous pressure     Stress test: 07/29/2021   This study is normal with no evidence of significant myocardial ischemia. Left ventricular systolic function is normal. There are no high risk prognostic indicators present.  The pharmacologic ECG portion of the study is negative for ischemia.           Assessment & Plan   63 y.o. adult patient with the following medical problems:     Hypertension.  Hyperlipidemia.  Diabetes.  History of venous thromboembolism.  Encounter for preoperative cardiac restratification. Clinically doing well, NYHA class I with no limiting symptoms.  Recent echocardiogram and stress testing has showed normal left ventricular systolic function and no significant inducible ischemia.  Risk factors appear well-controlled, blood pressure has been well-controlled over the past year and her recent lipid panel shows LDL at goal.  Mild elevation in triglycerides, will ask to follow-up with PCP.  She will be considered low cardiac risk for upcoming planned vulvoplasty.       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 Concentric remodeling. Mild basal septal thickening. The left ventricular systolic function is normal. The ejection fraction by Simpson's biplane method is 56%. There are no segmental wall motion abnormalities. Normal left ventricular diastolic function. Normal left atrial pressure. Right Ventricle: Ventricle not well seen. The right ventricular systolic function is normal. No hemodynamically significant abnormalities. Normal central venous pressure  07/29/21 - Procedure: ADAC MULTI GATED SESTAMIBI REGADENOSON MPI STRESS TEST: Left Ventricular Ejection Fraction (post stress, in the resting state) =  75 %. Left Ventricular End Diastolic Volume: 49 mL. This study is normal with no evidence of significant myocardial ischemia. Left ventricular systolic function is normal. There are no high risk prognostic indicators present.  The pharmacologic ECG portion of the study is negative for ischemia.        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     Review of Systems  Review of Systems   Constitutional: Negative.   HENT: Negative.     Eyes: Negative.    Cardiovascular:  Positive for dyspnea on exertion and leg swelling.   Respiratory: Negative.     Endocrine: Negative.    Hematologic/Lymphatic: Negative.    Skin: Negative.    Musculoskeletal: Negative.    Gastrointestinal: Negative.    Genitourinary: Negative.    Neurological: Negative.    Psychiatric/Behavioral: Negative.     Allergic/Immunologic: Negative.      14 point review of systems negative except as above.    Vitals:    09/19/23 1207   BP: 112/79   BP Source: Arm, Left Upper   Pulse: 70   SpO2: 99%   O2 Device: None (Room air)   PainSc: Zero   Weight: 67.1 kg (148 lb)   Height: 170.2 cm (5' 7)     Body mass index is 23.18 kg/m?SABRA     Physical Exam  General Appearance: no acute distress  HEENT: EOMI, mucous membranes moist, oropharynx is clear  Neck Veins: neck veins are flat & not distended  Carotid Arteries: no bruits  Chest Inspection: chest is normal in appearance  Auscultation/Percussion: lungs clear to auscultation, no rales, rhonchi, or wheezing  Cardiac Rhythm: regular rhythm & normal rate  Cardiac Auscultation: Normal S1 & S2, no S3 or S4, no rub  Murmurs: no cardiac murmurs  Abdominal Exam: soft, non-tender, normal bowel sounds, no masses or bruits  Abdominal aorta: nonpalpable   Liver & Spleen: no organomegaly  Extremities: no lower extremity edema; palpable distal pulses  Skin: warm & intact  Neurologic Exam: oriented to time, place and person; no focal neurologic deficits       Cardiovascular Studies  07/29/21   2D + DOPPLER ECHO   Result Value Ref Range    BSA 1.87 m2    LVIDD 3.4 3.8 - 5.2 cm    IVS 1.2 0.6 - 0.9 cm    PW 1 0.6 - 0.9 cm    LVIDS 2.4 2.2 - 3.5 cm    FS 29.41 28 - 44 %    Teichholtz 50.00 %    LA volume 28 22 - 52 mL    Sinus 3.8 2.4 - 3.6 cm    Ascending aorta 3.3 cm    LV mass 114 67 - 162 g    LA size 3.3 2.7 - 3.8 cm    RWT 0.59 <=0.42    AV peak velocity 1.0 m/s    Aortic valve area = 3.20 cm2    AV index (native) 1.10     E/A ratio 0.74     TDI lateral e'  0.094 m/s    E wave decelartion time 275.0 msec    LVOT diameter 2.0 cm    LVOT area 3.14 cm2    LVOT peak vel 1.1 m/s    LVOT peak VTI 23.9 cm    Ao VTI 23.5 cm    LVOT stroke volume 75.08 cm3    Lateral E/E' ratio 9.36     Right Heart Systolic TDI S' 1.0 m/s    Right Heart Systolic Mmode TAPSE 1.9 >1.7 cm    MV Peak E Vel PW 0.880 m/s    MV Peak A Vel 1.190 m/s    Left Atrium Index 14.97 16 - 34 mL/m2    Cardiology Ultrasound Machine [employer] Epiq     Left Ventricle Mass Index 61 43 - 95 g/m2    Left Ventricle Diastolic Volume 91 46 - 106 mL    Left Ventricle Diastolic Volume Index 49 29 - 61 mL/m2    Left Ventricle Systolic Volume 40 14 - 42 mL    Left Ventricle Systolic Volume Index 21 8 - 24 mL/m2    TDI Medial e' 0.062 m/s    Medial E/E' ratio 14.19     TV rest pulmonary artery pressure 24 mmHg    TR PEAK VELOCITY 2.3 m/s    RV SYSTOLIC PRESSURE 21     RA PRESSURE 3     SIMPSON'S BIPLANE EF 56 %        Cardiovascular Health Factors  Vitals BP Readings from Last 3 Encounters:   09/19/23 112/79   09/05/23 (!) (P) 156/71   12/20/22 131/67     Wt Readings from Last 3 Encounters:   09/19/23 67.1 kg (148 lb)   09/05/23 69.9 kg (154 lb 1.6 oz)   12/20/22 69.9 kg (154 lb)     BMI Readings from Last 3 Encounters:   09/19/23 23.18 kg/m?   09/05/23 24.87 kg/m?   12/20/22 24.86 kg/m?      Smoking Social History     Tobacco Use   Smoking Status Never   Smokeless Tobacco Never      Lipid Profile Cholesterol   Date Value Ref Range Status   08/22/2023 126  Final     HDL   Date Value Ref Range Status   08/22/2023 53  Final     LDL   Date Value Ref Range Status   08/22/2023 44  Final     Triglycerides   Date Value Ref Range Status   08/22/2023 220 (H) <150 Final      Blood Sugar Hemoglobin A1C   Date Value Ref Range Status   10/08/2018 11.7 (H) 4.8 - 5.6 Final     Glucose   Date Value Ref Range Status   08/22/2023 155 (H) 65 - 99 Final   11/03/2022 131 (H) 70 - 100 MG/DL Final   91/71/7975 774 (H) 70 - 100 MG/DL Final     Glucose, POC   Date Value Ref Range Status   11/03/2022 168 (H) 70 - 100 MG/DL Final   91/70/7975 92 70 - 100 MG/DL Final   91/76/7975 82 70 - 100 MG/DL Final        ASCVD Risk Assessment:     ASCVD 10-year risk calculated: The ASCVD Risk score (Arnett DK, et al., 2019) failed to calculate for the following reasons:    The valid total cholesterol range is 130 to 320 mg/dL     LDL 29-810, if ASCVD 10-y risk is >7.5%, high  to moderate-intensity statin therapy is recommended  Diabetes with ASCVD 10-y risk >7.5%, high-intensity statin therapy is recommended.  Diabetes with ASCVD 10-y risk <7.5%, moderate-intensity statin therapy is recommended.      Current Medications (including today's revisions)   amitriptyline (ELAVIL) 25 mg tablet Take one tablet by mouth at bedtime daily.    amLODIPine  (NORVASC ) 10 mg tablet Take one tablet by mouth daily.    cyanocobalamin (vitamin B-12) (VITAMIN B-12) 1,000 mcg tablet Take one tablet by mouth daily.    empagliflozin  (JARDIANCE ) 10 mg tablet Take one tablet by mouth daily.    ferrous sulfate (FEOSOL) 325 mg (65 mg iron) tablet Take one tablet by mouth daily. Take on an empty stomach at least 1 hour before or 2 hours after food.    glipiZIDE  CR (GLUCOTROL  XL) 10 mg tablet Take one tablet by mouth daily with breakfast.    hydrOXYzine HCL (ATARAX) 25 mg tablet Take one tablet by mouth daily as needed.    insulin  aspart (U-100) (NOVOLOG  FLEXPEN U-100 INSULIN ) 100 unit/mL (3 mL) PEN Inject  under the skin three times daily with meals. High dose correction factor/sliding scale    LANTUS  SOLOSTAR U-100 INSULIN  100 unit/mL (3 mL) injection PEN Inject thirty five Units under the skin at bedtime daily.    lisinopriL (ZESTRIL) 10 mg tablet Take one tablet by mouth daily.    metoprolol  tartrate 25 mg tablet Take one tablet by mouth twice daily.    omeprazole DR (PRILOSEC) 20 mg capsule Take one capsule by mouth daily before breakfast.    OZEMPIC 0.25 mg or 0.5 mg (2 mg/3 mL) injection PEN Inject one mg under the skin every 7 days.    rosuvastatin  (CRESTOR ) 20 mg tablet TAKE ONE TABLET BY MOUTH EVERY DAY         Rosamond Boettcher MD  Cardiovascular Medicine.

## 2023-10-05 ENCOUNTER — Encounter: Admit: 2023-10-05 | Discharge: 2023-10-05 | Primary: Family

## 2023-10-05 DIAGNOSIS — E611 Iron deficiency: Secondary | ICD-10-CM

## 2023-10-05 DIAGNOSIS — D6851 Activated protein C resistance: Principal | ICD-10-CM

## 2023-10-05 NOTE — Telephone Encounter
 Patient requesting appointment with Dr. Dasie regarding recent labs. She states PCP recommended she come back to see Dr. Dasie.

## 2023-10-30 ENCOUNTER — Encounter: Admit: 2023-10-30 | Discharge: 2023-10-30 | Primary: Family

## 2023-10-30 DIAGNOSIS — Z789 Other specified health status: Secondary | ICD-10-CM

## 2023-10-30 DIAGNOSIS — D6851 Activated protein C resistance: Principal | ICD-10-CM

## 2023-10-30 DIAGNOSIS — E611 Iron deficiency: Secondary | ICD-10-CM

## 2023-10-30 NOTE — Progress Notes
 Date: 10/30/2023    Name: Barbara Obrien  DOB: [birth year]  MRN: [id number]    Primary Care Physician: Raymondo Lauraine PARAS  Gyn: Dr. Hoy Pereyra     Chief Complaint:   Chief Complaint   Patient presents with    Heme/Onc Care     Hematology history  1.  2020: Diagnosed with provoked VTE/PE while on estrogen replacement, discontinued.  Hypercoagulable panel 2022 showed factor V Leiden heterozygous mutation, likely not related to ongoing risk for blood clot with numerous orthopedic surgeries, breast augmentation without any further blood clots.  Remains off estrogen.  Eliquis discontinued 2024 when I saw her for preoperative clearance, does not need to remain on it indefinitely unless she has a second blood clot.  2.  10/2023: Referred back for iron deficiency.  May be due to vegetarian lifestyle.  Started taking oral iron.  No blood donations.  Reports negative Cologuard, Hemoccult.  Recent colonoscopy.    Interval events  Abia  returns now for evaluation of iron deficiency.  She had labs checked 7/3 showing ferritin 6, iron 26, TIBC 404, iron saturation 6%.  Hemoglobin 12.9.  She has chronic fatigue but this is her baseline, no change.  She does report following a vegetarian diet.  She started taking oral iron early July.  She tolerates this daily without any GI difficulty.  She cannot recall her last colonoscopy, states it has been several years.  However she reports negative Cologuard last year, recent negative Hemoccult.  Denies any melena, hematochezia.  No blood donations.    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    She has a past surgical history that includes acl reconstruction (Right, 2015) (1992, 2015 x 2, 2023); rotator cuff repair (Right); breast augmentation (Bilateral); Inguinal hernia repair (Right); Leg Debridement (Right); knee arthroscopy (Right); colonoscopy; laparoscopy surg rpr initial inguinal hernia; vaginoplasty (N/A, 10/26/2022) (VAGINOPLASTY FOR INTERSEX STATE performed by Aura Asberry CROME, MD at [facility] OR); Penis Amputation (N/A, 10/26/2022) (AMPUTATION PENIS - COMPLETE performed by Broghammer, Fonda LABOR, MD at [facility] OR); orchiectomy (Bilateral, 10/26/2022) (ORCHIECTOMY - SIMPLE performed by Broghammer, Fonda LABOR, MD at [facility] OR); urethroplasty (Bilateral, 10/26/2022) (URETHROPLASTY RECONSTRUCTION URETHRA - FEMALE performed by Broghammer, Fonda LABOR, MD at [facility] OR); Scrotal Surgery (Bilateral, 10/26/2022) (RESECTION SCROTUM performed by Broghammer, Fonda LABOR, MD at [facility]); INTERSEX SURGERY (N/A, 10/26/2022) (CLITOROPLASTY FOR INTERSEX STATE performed by Pereyra Hoy POUR, MD at [facility] OR); tissue transfer (N/A, 10/26/2022) (ADJACENT TISSUE TRANSFER PEDICLE FLAP DEFECT 10.1 - 30.0 SQ CM - GENITALIA performed by Pereyra Hoy POUR, MD at [facility] OR); and cosmetic surgery.    Tapanga's family history includes Alzheimer's in her mother; Cancer in her father; Cancer (age of onset: 64) in her mother; Cancer-Prostate in her father; Diabetes in her mother; Heart Attack in her brother and brother; None Reported in her brother and brother.    Social history: Widowed, never smoker, no alcohol use. Transgender female. Has worked in Teacher, music.       Medications    Current Outpatient Medications:  amitriptyline (ELAVIL) 25 mg tablet, Take one tablet by mouth at bedtime daily., Disp: , Rfl:     amLODIPine  (NORVASC ) 10 mg tablet, Take one tablet by mouth daily., Disp: , Rfl:     cyanocobalamin (vitamin B-12) (VITAMIN B-12) 1,000 mcg tablet, Take one tablet by mouth daily., Disp: , Rfl:     empagliflozin  (JARDIANCE ) 10 mg tablet, Take one tablet by mouth daily., Disp: 90 tablet, Rfl: 3    ferrous sulfate (FEOSOL) 325 mg (65 mg iron) tablet, Take one tablet by mouth daily. Take on an empty stomach at least 1 hour before or 2 hours after food., Disp: , Rfl:     glipiZIDE  CR (GLUCOTROL  XL) 10 mg tablet, Take one tablet by mouth daily with breakfast., Disp: , Rfl:     hydrOXYzine HCL (ATARAX) 25 mg tablet, Take one tablet by mouth daily as needed., Disp: , Rfl:     insulin  aspart (U-100) (NOVOLOG  FLEXPEN U-100 INSULIN ) 100 unit/mL (3 mL) PEN, Inject  under the skin three times daily with meals. High dose correction factor/sliding scale, Disp: , Rfl:     LANTUS  SOLOSTAR U-100 INSULIN  100 unit/mL (3 mL) injection PEN, Inject thirty five Units under the skin at bedtime daily., Disp: , Rfl:     lisinopriL (ZESTRIL) 10 mg tablet, Take one tablet by mouth daily., Disp: , Rfl:     metoprolol  tartrate 25 mg tablet, Take one tablet by mouth twice daily., Disp: , Rfl:     omeprazole DR (PRILOSEC) 20 mg capsule, Take one capsule by mouth daily before breakfast., Disp: , Rfl:     OZEMPIC 0.25 mg or 0.5 mg (2 mg/3 mL) injection PEN, Inject one mg under the skin every 7 days., Disp: , Rfl:     rosuvastatin  (CRESTOR ) 20 mg tablet, TAKE ONE TABLET BY MOUTH EVERY DAY, Disp: 90 tablet, Rfl: 3    Allergies:   Allergies   Allergen Reactions    Contrast Dye Iv, Iodine  Containing [Iodinated Contrast Media] HIVES    Sulfa (Sulfonamide Antibiotics) HIVES    Tramadol SEE COMMENTS     Sweating and lightheaded        Review of Systems  Review of Systems   Constitutional:  Positive for fatigue (Chronic, Unchanged).   HENT: Negative.     Eyes: Negative.    Respiratory: Negative.     Cardiovascular: Negative.    Gastrointestinal: Negative.    Endocrine: Negative. Genitourinary: Negative.    Musculoskeletal: Negative.    Skin: Negative.    Allergic/Immunologic: Negative.    Neurological: Negative.    Hematological: Negative.    Psychiatric/Behavioral: Negative.     All other systems reviewed and are negative.    [place] Cooperative Oncology Group performance status is 0, Fully active, able to carry on all pre-disease performance without restriction.    Physical Exam  Vitals:    10/30/23 0912   BP: 131/69   BP Source: Arm, Right Upper   Pulse: 68   Temp: 36.2 ?C (97.1 ?F)   Resp: 18   SpO2: 100%   TempSrc: Temporal   PainSc: Zero   Weight: 67.5 kg (148 lb 12.8 oz)      Physical Exam  Constitutional:       General: She is not in acute distress.     Appearance: She is not ill-appearing.   Pulmonary:      Effort: No respiratory distress.   Skin:     Coloration: Skin is not jaundiced or pale.  Neurological:      General: No focal deficit present.      Mental Status: She is alert and oriented to person, place, and time.      Motor: No weakness.      Coordination: Coordination normal.   Psychiatric:         Mood and Affect: Mood normal.         Behavior: Behavior normal.         Thought Content: Thought content normal.         Judgment: Judgment normal.            Labs/ Imaging /Pathology  CBC, iron panel, PCP note reviewed.  Today's CBC reviewed, hgb 13.4   Latest Reference Range & Units 10/30/23 09:34   Iron 50 - 160 ?g/dL 55   % Saturation 28 - 42 % 12 (L)   Iron Binding-TIBC 270 - 380 mcg/dL 457 (H)   Ferritin 10 - 200 ng/mL 12   Transferrin 185 - 336 mg/dL 692   (L): Data is abnormally low  (H): Data is abnormally high        Assessment & Plan:  Braxton is a 63 year old female with the following medical problems:    1.  Iron deficiency.  Very likely due to vegetarian diet with decreased iron levels.  No blood donations.  Cannot recall last colonoscopy.  Reports negative Cologuard 2024, recently negative Hemoccult.  Denies melena, hematochezia.  Started oral iron early 09/2023, tolerates it well.  2.  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  3.  History of provoked lower extremity DVT/PE 2020 while on estrogen.  Estrogen was permanently discontinued.  Has been on Eliquis since that time.  4.  Vegetarian diet.    Current plan:  1.  Rechecked CBC, iron panel today after 2 months of oral iron with improved levels as above.  2.  We did discuss the possibility of IV iron dextran, but as her levels are improved she would like to continue oral iron.  That is very reasonable.  I would recommend she remain on oral iron indefinitely with the vegetarian diet.  3.  She can continue to have labs checked periodically with her PCP with referral back if iron levels are not being maintained despite oral iron.    4.  Return to clinic as needed.    Thank you for the opportunity to participate in her care.    Parts of this note were created with voice recognition software. Please excuse any grammatical or typographical errors.

## 2023-12-27 IMAGING — US ECHOCOMPL
1 series · 12 of 24 positions shown · non-contrast
Comparison: none

[Series 1: us echo 2d, complete · 108 acquisitions, 12 frames shown]
[im 5/108]
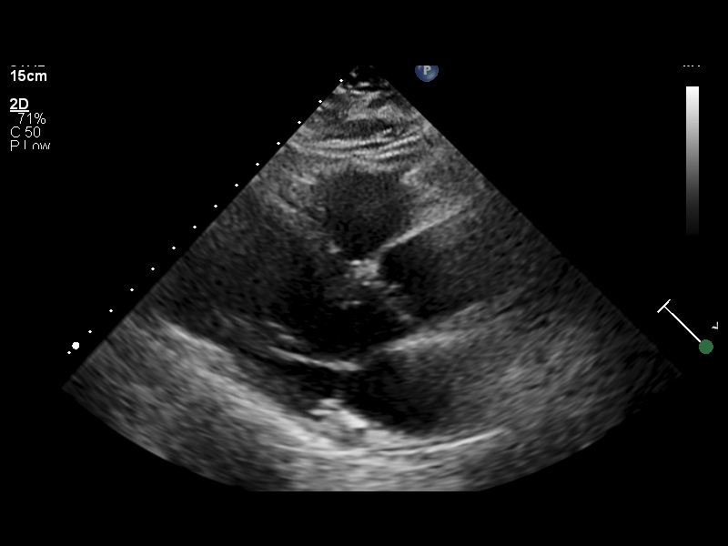
[im 14/108]
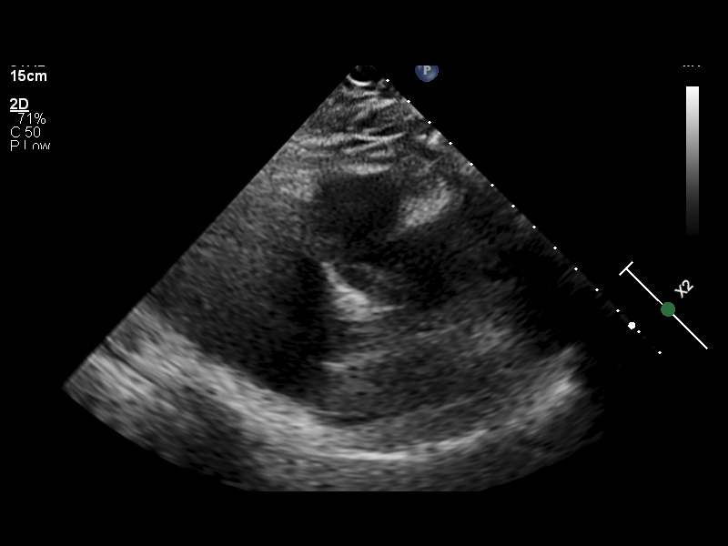
[im 24/108]
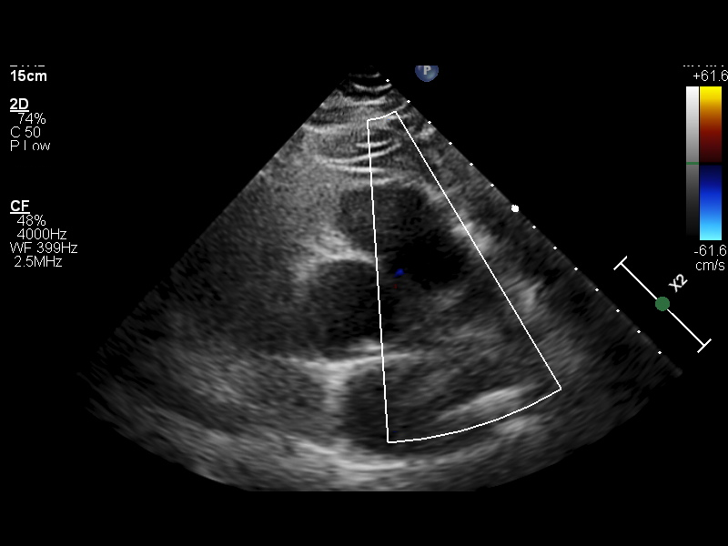
[im 33/108]
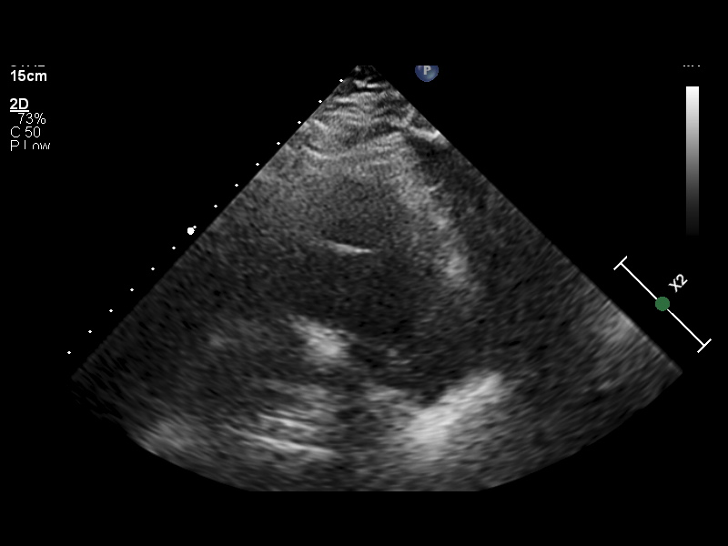
[im 42/108]
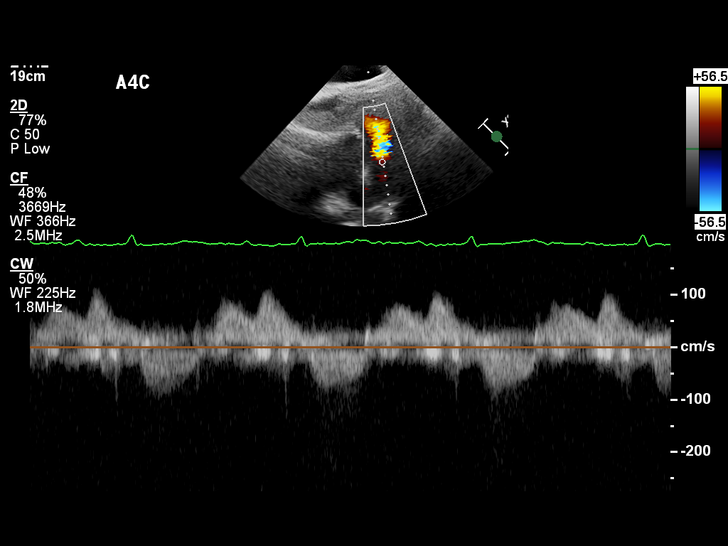
[im 52/108]
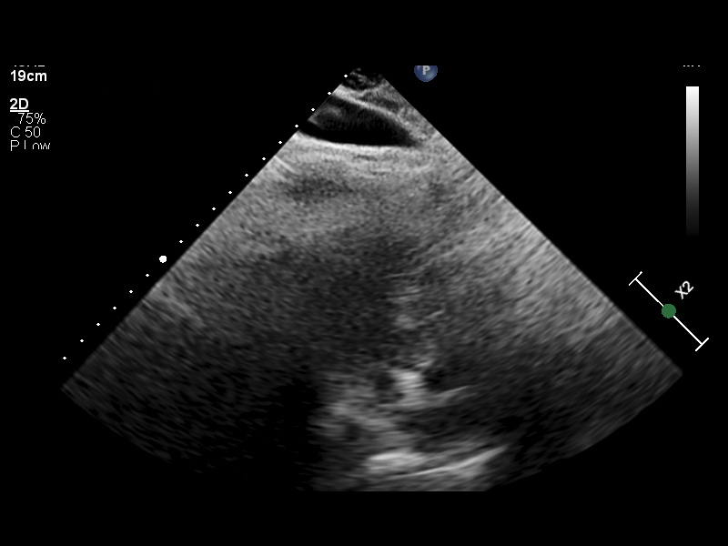
[im 61/108]
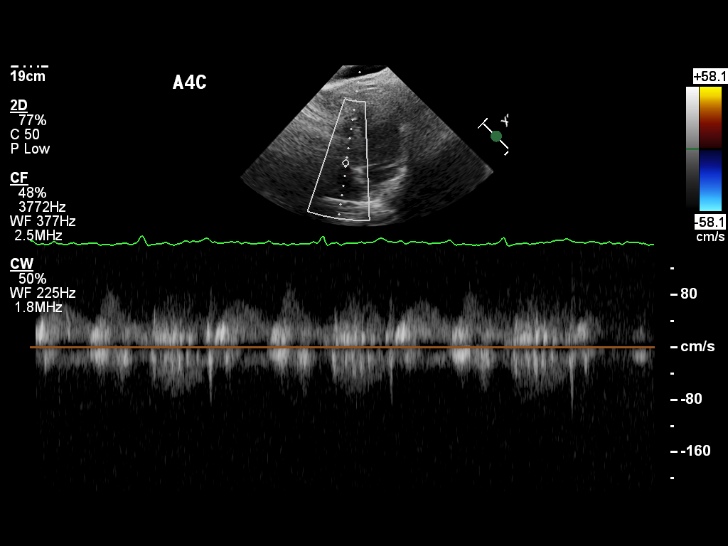
[im 70/108]
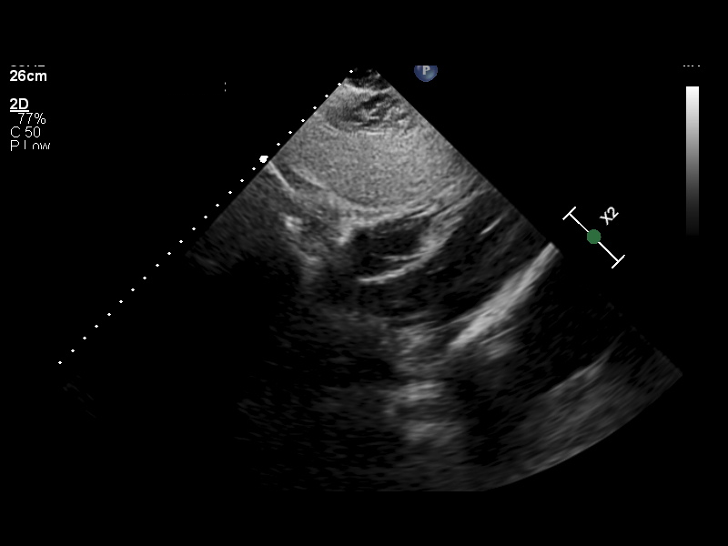
[im 84/108]
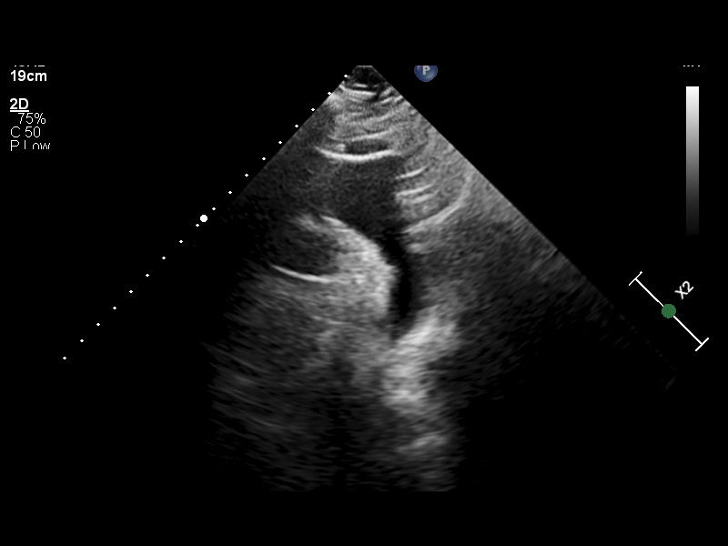
[im 89/108]
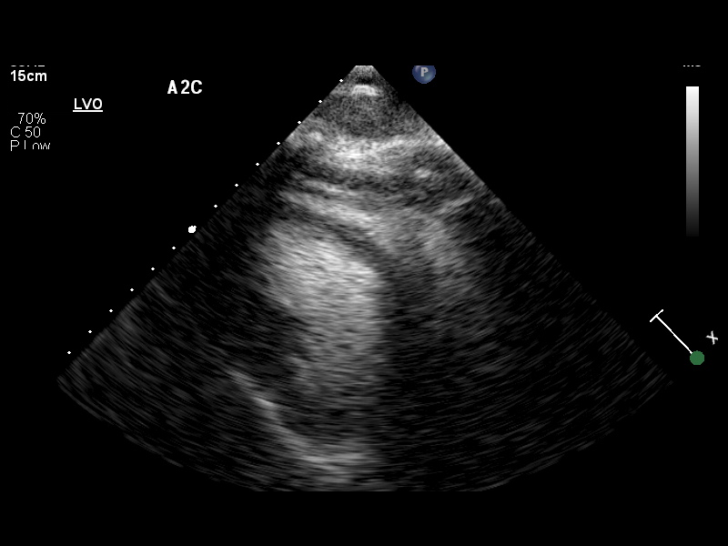
[im 98/108]
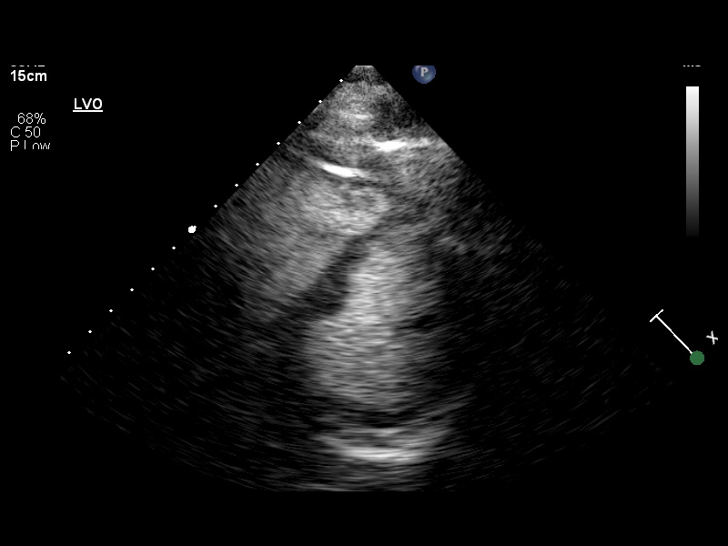
[im 108/108]
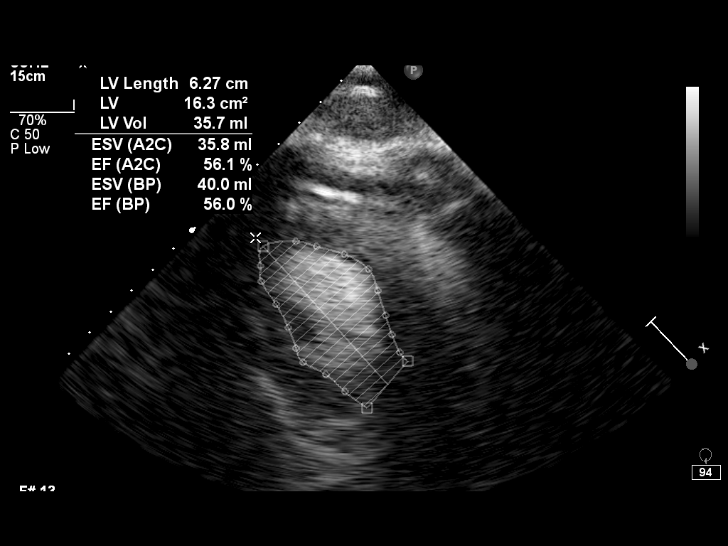

[12 of 24 positions shown; findings below may reference images not displayed]

07/29/21 -  2D + DOPPLER ECHO
Location Performed: [HOSPITAL]

Referring Provider:
Interpreting Physician: Onacram, Don Lolito
Aounallah:
Location of Interp: [facility] [REDACTED]
Sonographer: External Staff
Machine:  [employer]

Indications:           Screening for heart disease, [place]

Definity contrast was given to enhance imaging.

Vitals
Height   Weight   BSA (Calculated)   BP   Comments
170.2 cm (5' 7")   74 kg (163 lb 2.3 oz)   1.87   108/70

Interpretation Summary

Left Ventricle: The left ventricular size is normal. Concentric remodeling. Mild basal septal
thickening. The left ventricular systolic function is normal. The ejection fraction by Simpson's
biplane method is 56%. There are no segmental wall motion abnormalities. Normal left ventricular
diastolic function. Normal left atrial pressure.
Right Ventricle: Ventricle not well seen. The right ventricular systolic function is normal.
No hemodynamically significant abnormalities.
Normal central venous pressure

Technically difficult study.

Echocardiographic Findings
Left Ventricle   The left ventricular size is normal. Concentric remodeling. Mild basal septal
thickening. The left ventricular systolic function is normal. The ejection fraction by Simpson's
biplane method is 56%. There are no segmental wall motion abnormalities. Normal left ventricular
diastolic function. Normal left atrial pressure.
Right Ventricle   Ventricle not well seen. The right ventricular systolic function is normal.
Left Atrium   Normal size.
Right Atrium   Not well seen. Normal size.
IVC/SVC   Normal central venous pressure (0-5 mm Hg).
Mitral Valve   Normal valve structure. No stenosis. Trace regurgitation.
Tricuspid Valve   Normal valve structure. No stenosis. Trace regurgitation.
Aortic Valve   Tricuspid aortic valve. No stenosis. Trace regurgitation.
Pulmonary   The pulmonic valve was not seen well but no Doppler evidence of stenosis. No
regurgitation.
Aorta   The aortic root and ascending aorta are normal in size.
Pericardium   No pericardial effusion.

Left Ventricular Wall Scoring
Resting   Score Index: 1.000   Percent Normal: 100.0%

The left ventricular wall motion is normal.

Left Heart 2D Measurements (Normal Ranges)
EF (Simpson's)
56 %
LVIDD
3.4 cm  (Range: 3.8 - 5.2)
LVIDS
2.4 cm  (Range: 2.2 - 3.5)
IVS
1.2 cm  (Range: 0.6 - 0.9)
LV PW
1 cm  (Range: 0.6 - 0.9)
LA Size
3.3 cm  (Range: 2.7 - 3.8)

Right Heart 2D   M-Mode Measurements (Normal Ranges) (Range)
M-Mode TAPSE
1.9 cm  (>1.7)

Left Heart 2D Addnl Measurements (Normal Ranges)
LV Systolic Vol
40 mL  (Range: 14 - 42)
LV Systolic Vol Index
21 mL/m2  (Range: 8 - 24)
LV Diastolic Vol
91 mL  (Range: 46 - 106)
LV Diastolic Vol Index
49 mL/m2  (Range: 29 - 61)
LA Vol
28 mL  (Range: 22 - 52)
LA Vol Index
14.97 mL/m2  (Range: 16 - 34)
LV Mass
114 g  (Range: 67 - 162)
LV Mass Index
61 g/m2  (Range: 43 - 95)
RWT
0.59  (Range: <=0.42)

Aortic Root Measurements (Normal Ranges)
Sinus
3.8 cm  (Range: 2.4 - 3.6)
MELCIOR BAU
3.3 cm

Doppler (Spectral and Color Flow)
Estimated Peak Systolic PA Pressure
Aortic valve area
3.20 cm2
Aortic valve peak velocity
1.0 m/s
Aortic valve velocity ratio

Tech Notes:

Definity RT 3223. Reviewed allergies. JL

## 2024-03-15 ENCOUNTER — Encounter: Admit: 2024-03-15 | Discharge: 2024-03-15 | Primary: Family
